# Patient Record
Sex: Male | Born: 1950 | Race: White | Hispanic: No | Smoking: Never smoker
Health system: Southern US, Community
[De-identification: ages and names within clinical notes are randomized; demographics above are authoritative.]

## PROBLEM LIST (undated history)

## (undated) DIAGNOSIS — R339 Retention of urine, unspecified: Secondary | ICD-10-CM

## (undated) DIAGNOSIS — N400 Enlarged prostate without lower urinary tract symptoms: Secondary | ICD-10-CM

## (undated) DIAGNOSIS — F329 Major depressive disorder, single episode, unspecified: Secondary | ICD-10-CM

## (undated) DIAGNOSIS — G629 Polyneuropathy, unspecified: Secondary | ICD-10-CM

## (undated) DIAGNOSIS — G8220 Paraplegia, unspecified: Secondary | ICD-10-CM

## (undated) DIAGNOSIS — E559 Vitamin D deficiency, unspecified: Secondary | ICD-10-CM

## (undated) DIAGNOSIS — F32A Depression, unspecified: Secondary | ICD-10-CM

## (undated) DIAGNOSIS — K5900 Constipation, unspecified: Secondary | ICD-10-CM

## (undated) DIAGNOSIS — I609 Nontraumatic subarachnoid hemorrhage, unspecified: Secondary | ICD-10-CM

## (undated) DIAGNOSIS — K219 Gastro-esophageal reflux disease without esophagitis: Secondary | ICD-10-CM

## (undated) DIAGNOSIS — D689 Coagulation defect, unspecified: Secondary | ICD-10-CM

## (undated) DIAGNOSIS — I671 Cerebral aneurysm, nonruptured: Secondary | ICD-10-CM

## (undated) DIAGNOSIS — F419 Anxiety disorder, unspecified: Secondary | ICD-10-CM

## (undated) DIAGNOSIS — N319 Neuromuscular dysfunction of bladder, unspecified: Secondary | ICD-10-CM

## (undated) HISTORY — PX: SPLENECTOMY: SUR1306

## (undated) HISTORY — PX: CHOLECYSTECTOMY: SHX55

---

## 2006-02-18 DIAGNOSIS — I609 Nontraumatic subarachnoid hemorrhage, unspecified: Secondary | ICD-10-CM

## 2006-02-18 DIAGNOSIS — I671 Cerebral aneurysm, nonruptured: Secondary | ICD-10-CM

## 2006-02-18 DIAGNOSIS — G8220 Paraplegia, unspecified: Secondary | ICD-10-CM

## 2006-02-18 HISTORY — DX: Cerebral aneurysm, nonruptured: I67.1

## 2006-02-18 HISTORY — DX: Paraplegia, unspecified: G82.20

## 2006-02-18 HISTORY — DX: Nontraumatic subarachnoid hemorrhage, unspecified: I60.9

## 2013-05-16 ENCOUNTER — Emergency Department (HOSPITAL_COMMUNITY): Payer: Medicare Other

## 2013-05-16 ENCOUNTER — Emergency Department (HOSPITAL_COMMUNITY)
Admission: EM | Admit: 2013-05-16 | Discharge: 2013-05-17 | Disposition: A | Discharge status: Home / Self Care | Payer: Medicare Other | Attending: Emergency Medicine | Admitting: Emergency Medicine

## 2013-05-16 ENCOUNTER — Encounter (HOSPITAL_COMMUNITY): Payer: Self-pay | Admitting: Emergency Medicine

## 2013-05-16 DIAGNOSIS — R630 Anorexia: Secondary | ICD-10-CM | POA: Insufficient documentation

## 2013-05-16 DIAGNOSIS — R509 Fever, unspecified: Secondary | ICD-10-CM | POA: Insufficient documentation

## 2013-05-16 DIAGNOSIS — N390 Urinary tract infection, site not specified: Secondary | ICD-10-CM | POA: Insufficient documentation

## 2013-05-16 DIAGNOSIS — R5383 Other fatigue: Secondary | ICD-10-CM

## 2013-05-16 DIAGNOSIS — Z7901 Long term (current) use of anticoagulants: Secondary | ICD-10-CM | POA: Insufficient documentation

## 2013-05-16 DIAGNOSIS — Z79899 Other long term (current) drug therapy: Secondary | ICD-10-CM | POA: Insufficient documentation

## 2013-05-16 DIAGNOSIS — Z8679 Personal history of other diseases of the circulatory system: Secondary | ICD-10-CM | POA: Insufficient documentation

## 2013-05-16 DIAGNOSIS — D689 Coagulation defect, unspecified: Secondary | ICD-10-CM | POA: Insufficient documentation

## 2013-05-16 DIAGNOSIS — R5381 Other malaise: Secondary | ICD-10-CM | POA: Insufficient documentation

## 2013-05-16 DIAGNOSIS — R112 Nausea with vomiting, unspecified: Secondary | ICD-10-CM | POA: Insufficient documentation

## 2013-05-16 HISTORY — DX: Coagulation defect, unspecified: D68.9

## 2013-05-16 LAB — URINALYSIS, ROUTINE W REFLEX MICROSCOPIC
Bilirubin Urine: NEGATIVE
GLUCOSE, UA: NEGATIVE mg/dL
Ketones, ur: NEGATIVE mg/dL
Nitrite: POSITIVE — AB
PROTEIN: 30 mg/dL — AB
SPECIFIC GRAVITY, URINE: 1.017 (ref 1.005–1.030)
UROBILINOGEN UA: 1 mg/dL (ref 0.0–1.0)
pH: 7 (ref 5.0–8.0)

## 2013-05-16 LAB — CBC WITH DIFFERENTIAL/PLATELET
Basophils Absolute: 0.1 10*3/uL (ref 0.0–0.1)
Basophils Relative: 1 % (ref 0–1)
EOS ABS: 0.2 10*3/uL (ref 0.0–0.7)
EOS PCT: 2 % (ref 0–5)
HCT: 41.4 % (ref 39.0–52.0)
Hemoglobin: 13.6 g/dL (ref 13.0–17.0)
LYMPHS ABS: 4.4 10*3/uL — AB (ref 0.7–4.0)
Lymphocytes Relative: 40 % (ref 12–46)
MCH: 33.8 pg (ref 26.0–34.0)
MCHC: 32.9 g/dL (ref 30.0–36.0)
MCV: 103 fL — AB (ref 78.0–100.0)
Monocytes Absolute: 1.1 10*3/uL — ABNORMAL HIGH (ref 0.1–1.0)
Monocytes Relative: 10 % (ref 3–12)
NEUTROS PCT: 48 % (ref 43–77)
Neutro Abs: 5.2 10*3/uL (ref 1.7–7.7)
Platelets: 412 10*3/uL — ABNORMAL HIGH (ref 150–400)
RBC: 4.02 MIL/uL — ABNORMAL LOW (ref 4.22–5.81)
RDW: 16 % — AB (ref 11.5–15.5)
WBC: 10.9 10*3/uL — ABNORMAL HIGH (ref 4.0–10.5)

## 2013-05-16 LAB — LIPASE, BLOOD: LIPASE: 24 U/L (ref 11–59)

## 2013-05-16 LAB — COMPREHENSIVE METABOLIC PANEL
ALBUMIN: 3.5 g/dL (ref 3.5–5.2)
ALK PHOS: 84 U/L (ref 39–117)
ALT: 6 U/L (ref 0–53)
AST: 10 U/L (ref 0–37)
BUN: 10 mg/dL (ref 6–23)
CALCIUM: 9.3 mg/dL (ref 8.4–10.5)
CO2: 24 mEq/L (ref 19–32)
Chloride: 104 mEq/L (ref 96–112)
Creatinine, Ser: 1.01 mg/dL (ref 0.50–1.35)
GFR calc Af Amer: >90 mL/min (ref >90)
GFR calc non Af Amer: 79 mL/min — ABNORMAL LOW (ref >90)
GLUCOSE: 98 mg/dL (ref 70–99)
Potassium: 4 mEq/L (ref 3.7–5.3)
Sodium: 142 mEq/L (ref 137–147)
TOTAL PROTEIN: 7 g/dL (ref 6.0–8.3)
Total Bilirubin: 0.4 mg/dL (ref 0.3–1.2)

## 2013-05-16 LAB — PROTIME-INR
INR: 1.32 (ref 0.00–1.49)
PROTHROMBIN TIME: 16.1 s — AB (ref 11.6–15.2)

## 2013-05-16 LAB — URINE MICROSCOPIC-ADD ON

## 2013-05-16 LAB — I-STAT CG4 LACTIC ACID, ED: Lactic Acid, Venous: 1.2 mmol/L (ref 0.5–2.2)

## 2013-05-16 MED ORDER — IOHEXOL 300 MG/ML  SOLN
50.0000 mL | Freq: Once | INTRAMUSCULAR | Status: AC | PRN
  Administered 2013-05-16: 50 mL via ORAL

## 2013-05-16 MED ORDER — IOHEXOL 300 MG/ML  SOLN
100.0000 mL | Freq: Once | INTRAMUSCULAR | Status: AC | PRN
  Administered 2013-05-16: 100 mL via INTRAVENOUS

## 2013-05-16 MED ORDER — DEXTROSE 5 % IV SOLN
1.0000 g | Freq: Once | INTRAVENOUS | Status: AC
  Administered 2013-05-16: 1 g via INTRAVENOUS
  Filled 2013-05-16: qty 10

## 2013-05-16 MED ORDER — ONDANSETRON HCL 4 MG/2ML IJ SOLN
4.0000 mg | Freq: Once | INTRAMUSCULAR | Status: AC
  Administered 2013-05-16: 4 mg via INTRAVENOUS
  Filled 2013-05-16: qty 2

## 2013-05-16 MED ORDER — SODIUM CHLORIDE 0.9 % IV SOLN
Freq: Once | INTRAVENOUS | Status: AC
  Administered 2013-05-16: 22:00:00 via INTRAVENOUS

## 2013-05-16 NOTE — ED Provider Notes (Signed)
CSN: [id number]     Arrival date & time 05/16/13  2013 History   First MD Initiated Contact with Patient 05/16/13 2028     Chief Complaint  Patient presents with  . Fever  . Nausea     (Consider location/radiation/quality/duration/timing/severity/associated sxs/prior Treatment) HPI Comments: Patient presents from nursing facility with nausea, vomiting abdominal pain and ongoing since 4 AM. He states he vomited 16 times a day that was reported to send nonbloody and nonbilious. Had some diarrhea yesterday x2 but none today. His diffuse crampy abdominal pain. It is felt warm but did not check his temperature. Denies sick contacts. He's been at [facility] care for the past 3 weeks after being hospitalized for over a month at an outside hospital. He stated he had bilious emesis at that time. He states there is no cause identified. 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 Patient states he has not had it in two days because there was some talk of switching him to xarelto which he didn't want to do. He's had multiple clots intra-abdominal as well as in his extremities. He denies any chest pain or shortness of breath. He denies any back pain. He denies any recent antibiotic use but he is uncertain. Has a suprapubic catheter in place.  The history is provided by the patient and the EMS personnel.    Past Medical History  Diagnosis Date  . Aneurysm   . Clotting disorder    History reviewed. No pertinent past surgical history. History reviewed. No pertinent family history. History  Substance Use Topics  . Smoking status: Never Smoker   . Smokeless tobacco: Never Used  . Alcohol Use: No    Review of Systems  Constitutional: Positive for fever, activity change and appetite change.  HENT: Negative for congestion and rhinorrhea.   Respiratory: Negative for cough, chest tightness and shortness of breath.   Cardiovascular: Negative for chest pain.  Gastrointestinal:  Positive for nausea, vomiting and abdominal pain. Negative for diarrhea.  Genitourinary: Negative for dysuria and hematuria.  Musculoskeletal: Negative for arthralgias, back pain and myalgias.  Skin: Negative for rash.  Neurological: Positive for weakness. Negative for dizziness and headaches.  A complete 10 system review of systems was obtained and all systems are negative except as noted in the HPI and PMH.      Allergies  Review of patient's allergies indicates no known allergies.  Home Medications   Current Outpatient Rx  Name  Route  Sig  Dispense  Refill  . acetaminophen (TYLENOL) 325 MG tablet   Oral   Take 650 mg by mouth every 6 (six) hours as needed for mild pain.         Marland Kitchen alum & mag hydroxide-simeth (MAALOX/MYLANTA) 200-200-20 MG/5ML suspension   Oral   Take 30 mLs by mouth every 6 (six) hours as needed for indigestion or heartburn.         . diazepam (VALIUM) 5 MG tablet   Oral   Take 5 mg by mouth 2 (two) times daily.         . folic acid (FOLVITE) 1 MG tablet   Oral   Take 1 mg by mouth daily.         . furosemide (LASIX) 20 MG tablet   Oral   Take 20 mg by mouth as needed for fluid or edema.         Marland Kitchen LORazepam (ATIVAN) 0.5 MG tablet   Oral   Take  0.5 mg by mouth every 8 (eight) hours as needed for anxiety.         . Multiple Vitamin (MULTIVITAMIN WITH MINERALS) TABS tablet   Oral   Take 1 tablet by mouth daily.         . mupirocin ointment (BACTROBAN) 2 %   Nasal   Place 1 application into the nose 3 (three) times daily.         . QUEtiapine (SEROQUEL) 25 MG tablet   Oral   Take 25 mg by mouth at bedtime.         . Rivaroxaban (XARELTO) 20 MG TABS tablet   Oral   Take 20 mg by mouth daily with supper.         . sertraline (ZOLOFT) 100 MG tablet   Oral   Take 100 mg by mouth daily.         . tamsulosin (FLOMAX) 0.4 MG CAPS capsule   Oral   Take 0.4 mg by mouth.         . Vitamin D, Ergocalciferol, (DRISDOL) 50000  UNITS CAPS capsule   Oral   Take 50,000 Units by mouth every 7 (seven) days. Wednesday          BP 145/81  Pulse 83  Temp(Src) 99.6 F (37.6 C) (Oral)  Resp 16  Wt 180 lb (81.647 kg)  SpO2 98% Physical Exam  Constitutional: He is oriented to person, place, and time. He appears well-developed and well-nourished. No distress.  HENT:  Head: Normocephalic and atraumatic.  Mouth/Throat: Oropharynx is clear and moist. No oropharyngeal exudate.  Eyes: Conjunctivae and EOM are normal. Pupils are equal, round, and reactive to light.  Neck: Normal range of motion. Neck supple.  Cardiovascular: Normal rate, regular rhythm and normal heart sounds.   Pulmonary/Chest: Effort normal and breath sounds normal. No respiratory distress.  Abdominal: Soft. There is no tenderness. There is no rebound and no guarding.  Suprapubic catheter in place. Abdomen soft, nontender, nondistended  Musculoskeletal: Normal range of motion. He exhibits no edema and no tenderness.  Intact DP and PT pulses  Neurological: He is alert and oriented to person, place, and time. No cranial nerve deficit. He exhibits normal muscle tone. Coordination normal.  Skin: Skin is warm.    ED Course  Procedures (including critical care time) Labs Review Labs Reviewed  CBC WITH DIFFERENTIAL - Abnormal; Notable for the following:    WBC 10.9 (*)    RBC 4.02 (*)    MCV 103.0 (*)    RDW 16.0 (*)    Platelets 412 (*)    Lymphs Abs 4.4 (*)    Monocytes Absolute 1.1 (*)    All other components within normal limits  COMPREHENSIVE METABOLIC PANEL - Abnormal; Notable for the following:    GFR calc non Af Amer 79 (*)    All other components within normal limits  URINALYSIS, ROUTINE W REFLEX MICROSCOPIC - Abnormal; Notable for the following:    APPearance CLOUDY (*)    Hgb urine dipstick MODERATE (*)    Protein, ur 30 (*)    Nitrite POSITIVE (*)    Leukocytes, UA SMALL (*)    All other components within normal limits   PROTIME-INR - Abnormal; Notable for the following:    Prothrombin Time 16.1 (*)    All other components within normal limits  URINE MICROSCOPIC-ADD ON - Abnormal; Notable for the following:    Bacteria, UA MANY (*)    Casts HYALINE CASTS (*)  All other components within normal limits  URINE CULTURE  LIPASE, BLOOD  I-STAT CG4 LACTIC ACID, ED   Imaging Review Ct Abdomen Pelvis W Contrast  05/17/2013   CLINICAL DATA:  Diffuse abdominal pain.  Fever.  Nausea.  EXAM: CT ABDOMEN AND PELVIS WITH CONTRAST  TECHNIQUE: Multidetector CT imaging of the abdomen and pelvis was performed using the standard protocol following bolus administration of intravenous contrast.  CONTRAST:  50mL OMNIPAQUE IOHEXOL 300 MG/ML SOLN, 100mL OMNIPAQUE IOHEXOL 300 MG/ML SOLN  COMPARISON:  None.  FINDINGS: BODY WALL: Unremarkable.  LOWER CHEST: Mild cardiomegaly.  Bibasilar atelectasis.  ABDOMEN/PELVIS:  Liver: No focal abnormality. Multiple small vascular structures in the porta hepatis consistent with cavernous transformation. There is calcification within the portal venous system which is likely from remote thrombus.  Biliary: Cholecystectomy.  Pancreas: No acute abnormality.  Spleen: Surgically absent.  Adrenals: Unremarkable.  Kidneys and ureters: No hydronephrosis or stone. Presumed sub cm cyst in the interpolar left kidney.  Bladder: Suprapubic catheter. The bladder is thick walled, but decompressed. No focal area of masslike enhancement.  Reproductive: Unremarkable.  Bowel: No obstruction. Normal appendix. Few distal colonic diverticula.  Retroperitoneum: No mass or adenopathy.  Peritoneum: No free fluid or gas.  Vascular: Flat IVC and bilateral iliac systems consistent with chronic occlusion. There are numerous venous collaterals in the subcutaneous abdominal wall.  OSSEOUS: Nonspecific intrathecal density/ calcification at the level of the lower thoracic spine, likely dystrophic calcifications from previous inflammation.   IMPRESSION: 1. No acute intra-abdominal findings. 2. 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   Electronically Signed   By: Tiburcio PeaJonathan  Watts M.D.   On: 05/17/2013 00:20     EKG Interpretation None      MDM   Final diagnoses:  Nausea and vomiting  Urinary tract infection   Nausea, vomiting, abdominal pain since 4 AM. Recent hospitalization for what the patient says with bilious emesis. Emesis is nonbilious. His abdomen is soft without peritoneal signs.  UA appears infected though the patient has suprapubic catheter. Labs unremarkable. Abdomen soft without peritoneal signs.  CT scan shows no acute findings. Patient's INR is 1.3. Patient insists he has not taken xarelto though it is listed on his MAR as given at 5pm. It seems patient is unclear about his anticoagulation. He states he would rather be on Coumadin. He will address this with his doctor at the facility tomorrow. As he received xarelto at 5 PM, we'll not give Lovenox.  Patient vomited after drinking contrast, but no further vomiting in the ED.  Tolerating PO.  Will treat UTI and send culture.   BP 145/81  Pulse 83  Temp(Src) 99.6 F (37.6 C) (Oral)  Resp 16  Wt 180 lb (81.647 kg)  SpO2 98%    Glynn OctaveStephen Talasia Saulter, MD 05/17/13 [phone]

## 2013-05-16 NOTE — ED Notes (Signed)
Pt BIB EMS, from [facility], pt reports n/v and abdominal pain since 0400 this morning. Noted to have a fever, no medication given at facility. Pt a&o x4, non ambulatory, skin warm and dry

## 2013-05-17 MED ORDER — ONDANSETRON 8 MG PO TBDP
8.0000 mg | ORAL_TABLET | Freq: Once | ORAL | Status: AC
  Administered 2013-05-17: 8 mg via ORAL
  Filled 2013-05-17: qty 1

## 2013-05-17 MED ORDER — ONDANSETRON HCL 4 MG PO TABS: 4.0000 mg | ORAL_TABLET | Freq: Four times a day (QID) | ORAL | Status: DC

## 2013-05-17 MED ORDER — CEPHALEXIN 500 MG PO CAPS: 500.0000 mg | ORAL_CAPSULE | Freq: Four times a day (QID) | ORAL | Status: DC

## 2013-05-17 MED ORDER — ENOXAPARIN SODIUM 100 MG/ML ~~LOC~~ SOLN: 82.0000 mg | Freq: Once | SUBCUTANEOUS | Status: DC

## 2013-05-17 NOTE — ED Notes (Signed)
[facility] called to transport pt at this time. 

## 2013-05-17 NOTE — Discharge Instructions (Signed)
Urinary Tract Infection  take the antibiotic and nausea medicine as prescribed. address your anticoagulation with your doctor tomorrow. You received the blood thinner medicine earlier tonight. Your records show that you have received xarelto.   Return to the ED if you develop new or worsening symptoms. Urinary tract infections (UTIs) can develop anywhere along your urinary tract. Your urinary tract is your body's drainage system for removing wastes and extra water. Your urinary tract includes two kidneys, two ureters, a bladder, and a urethra. Your kidneys are a pair of bean-shaped organs. Each kidney is about the size of your fist. They are located below your ribs, one on each side of your spine. CAUSES Infections are caused by microbes, which are microscopic organisms, including fungi, viruses, and bacteria. These organisms are so small that they can only be seen through a microscope. Bacteria are the microbes that most commonly cause UTIs. SYMPTOMS  Symptoms of UTIs may vary by age and gender of the patient and by the location of the infection. Symptoms in young women typically include a frequent and intense urge to urinate and a painful, burning feeling in the bladder or urethra during urination. Older women and men are more likely to be tired, shaky, and weak and have muscle aches and abdominal pain. A fever may mean the infection is in your kidneys. Other symptoms of a kidney infection include pain in your back or sides below the ribs, nausea, and vomiting. DIAGNOSIS To diagnose a UTI, your caregiver will ask you about your symptoms. Your caregiver also will ask to provide a urine sample. The urine sample will be tested for bacteria and white blood cells. White blood cells are made by your body to help fight infection. TREATMENT  Typically, UTIs can be treated with medication. Because most UTIs are caused by a bacterial infection, they usually can be treated with the use of antibiotics. The choice  of antibiotic and length of treatment depend on your symptoms and the type of bacteria causing your infection. HOME CARE INSTRUCTIONS  If you were prescribed antibiotics, take them exactly as your caregiver instructs you. Finish the medication even if you feel better after you have only taken some of the medication.  Drink enough water and fluids to keep your urine clear or pale yellow.  Avoid caffeine, tea, and carbonated beverages. They tend to irritate your bladder.  Empty your bladder often. Avoid holding urine for long periods of time.  Empty your bladder before and after sexual intercourse.  After a bowel movement, women should cleanse from front to back. Use each tissue only once. SEEK MEDICAL CARE IF:   You have back pain.  You develop a fever.  Your symptoms do not begin to resolve within 3 days. SEEK IMMEDIATE MEDICAL CARE IF:   You have severe back pain or lower abdominal pain.  You develop chills.  You have nausea or vomiting.  You have continued burning or discomfort with urination. MAKE SURE YOU:   Understand these instructions.  Will watch your condition.  Will get help right away if you are not doing well or get worse. Document Released: 11/14/2004 Document Revised: 08/06/2011 Document Reviewed: 03/15/2011 [facility] Patient Information 2014 [employer] [place].

## 2013-05-17 NOTE — ED Notes (Signed)
Pt noted to have vomited in CT, MD made aware

## 2013-05-18 LAB — URINE CULTURE

## 2013-08-23 ENCOUNTER — Ambulatory Visit (INDEPENDENT_AMBULATORY_CARE_PROVIDER_SITE_OTHER): Payer: Medicare Other | Admitting: Diagnostic Neuroimaging

## 2013-08-23 ENCOUNTER — Encounter: Payer: Self-pay | Admitting: Diagnostic Neuroimaging

## 2013-08-23 VITALS — BP 103/71 | HR 81 | Temp 97.6°F

## 2013-08-23 DIAGNOSIS — Z9889 Other specified postprocedural states: Secondary | ICD-10-CM

## 2013-08-23 DIAGNOSIS — M79609 Pain in unspecified limb: Secondary | ICD-10-CM

## 2013-08-23 DIAGNOSIS — M62838 Other muscle spasm: Secondary | ICD-10-CM

## 2013-08-23 DIAGNOSIS — F329 Major depressive disorder, single episode, unspecified: Secondary | ICD-10-CM

## 2013-08-23 DIAGNOSIS — Z8679 Personal history of other diseases of the circulatory system: Secondary | ICD-10-CM

## 2013-08-23 DIAGNOSIS — F3289 Other specified depressive episodes: Secondary | ICD-10-CM

## 2013-08-23 DIAGNOSIS — G8220 Paraplegia, unspecified: Secondary | ICD-10-CM

## 2013-08-23 DIAGNOSIS — I609 Nontraumatic subarachnoid hemorrhage, unspecified: Secondary | ICD-10-CM

## 2013-08-23 DIAGNOSIS — M79606 Pain in leg, unspecified: Secondary | ICD-10-CM

## 2013-08-23 DIAGNOSIS — F32A Depression, unspecified: Secondary | ICD-10-CM

## 2013-08-23 NOTE — Patient Instructions (Signed)
I will setup referral to palliative care.

## 2013-08-23 NOTE — Progress Notes (Signed)
[facility]  PATIENT: Daniel Wolfe DOB: Sep 09, 1950  REFERRING CLINICIAN: Marquis Lunch HISTORY FROM: patient REASON FOR VISIT: new consult   HISTORICAL  CHIEF COMPLAINT:  No chief complaint on file.   HISTORY OF PRESENT ILLNESS:   63 year old right-handed male with history of subarachnoid hemorrhage, status post cerebral aneurysm coiling ([facility]) in 2008, here for evaluation of lower extremity weakness.  02/19/06 patient went to the hospital with nausea, vomiting, malaise. While in the hospital patient had cardiac arrest, diagnosed with subarachnoid hemorrhage. Patient was treated with endovascular coiling. He was in a "coma" for 4 months. Patient went to rehabilitation and skilled nursing facility following this. Patient never was able to walk after this event to 2008. He had significant weakness of his lower extremities with pain, spasms, mobility issues.  Over past few months patient has had increasing lower extremity weakness and trouble with transferring. Patient is asking me to refer him to neurosurgery to "remove the coils", because patient is tired of living and wants to end it all.  Patient has significant depression and hopelessness. He saw psychiatry in the past. He's not interested in any further testing, treatment or evaluation.   REVIEW OF SYSTEMS: Full 14 system review of systems performed and notable only for weight loss fatigue swelling in legs hearing loss spinning sensation urination problem allergy joint pain joint swelling 8 muscle blurred vision easy bruising easy bleeding memory loss confusion weakness dizziness depression anxiety not asleep decrease or change in appetite disinterest activity suicidal thoughts racing thoughts.  ALLERGIES: No Known Allergies  HOME MEDICATIONS: Outpatient Prescriptions Prior to Visit  Medication Sig Dispense Refill  . acetaminophen (TYLENOL) 325 MG tablet Take 650 mg by mouth  every 6 (six) hours as needed for mild pain.      . diazepam (VALIUM) 5 MG tablet Take 5 mg by mouth 2 (two) times daily.      . furosemide (LASIX) 20 MG tablet Take 20 mg by mouth as needed for fluid or edema.      Marland Kitchen LORazepam (ATIVAN) 0.5 MG tablet Take 0.5 mg by mouth every 8 (eight) hours as needed for anxiety.      . Multiple Vitamin (MULTIVITAMIN WITH MINERALS) TABS tablet Take 1 tablet by mouth daily.      . mupirocin ointment (BACTROBAN) 2 % Place 1 application into the nose 3 (three) times daily.      . sertraline (ZOLOFT) 100 MG tablet Take 100 mg by mouth daily.      . tamsulosin (FLOMAX) 0.4 MG CAPS capsule Take 0.4 mg by mouth.      . Vitamin D, Ergocalciferol, (DRISDOL) 50000 UNITS CAPS capsule Take 50,000 Units by mouth every 7 (seven) days. Wednesday      . alum & mag hydroxide-simeth (MAALOX/MYLANTA) 200-200-20 MG/5ML suspension Take 30 mLs by mouth every 6 (six) hours as needed for indigestion or heartburn.      . cephALEXin (KEFLEX) 500 MG capsule Take 1 capsule (500 mg total) by mouth 4 (four) times daily.  40 capsule  0  . folic acid (FOLVITE) 1 MG tablet Take 1 mg by mouth daily.      . ondansetron (ZOFRAN) 4 MG tablet Take 1 tablet (4 mg total) by mouth every 6 (six) hours.  12 tablet  0  . QUEtiapine (SEROQUEL) 25 MG tablet Take 25 mg by mouth at bedtime.      . Rivaroxaban (XARELTO) 20 MG TABS tablet Take 20 mg by  mouth daily with supper.       No facility-administered medications prior to visit.    PAST MEDICAL HISTORY: Past Medical History  Diagnosis Date  . Aneurysm   . Clotting disorder     PAST SURGICAL HISTORY: History reviewed. No pertinent past surgical history.  FAMILY HISTORY: Family History  Problem Relation Age of Onset  . Lung cancer Mother   . Other Father     lung problems/not sure of cause of death    SOCIAL HISTORY:  History   Social History  . Marital Status: Divorced    Spouse Name: N/A    Number of Children: 1  . Years of  Education: College   Occupational History  .  Other    Retired   Social History Main Topics  . Smoking status: Never Smoker   . Smokeless tobacco: Never Used  . Alcohol Use: No  . Drug Use: No  . Sexual Activity: Not Currently   Other Topics Concern  . Not on file   Social History Narrative   Patient lives in [facility] assisted [facility]   Caffeine Use: none     PHYSICAL EXAM  Filed Vitals:   08/23/13 1036  BP: 103/71  Pulse: 81  Temp: 97.6 F (36.4 C)  TempSrc: Oral    Not recorded    Cannot calculate BMI with a height equal to zero.  GENERAL EXAM: Patient is in no distress; well developed, nourished and groomed; neck is supple; FLAT AFFECT.  CARDIOVASCULAR: Regular rate and rhythm, no murmurs, no carotid bruits  NEUROLOGIC: MENTAL STATUS: awake, alert, oriented to person, place and time, recent and remote memory intact, normal attention and concentration, language fluent, comprehension intact, naming intact, fund of knowledge appropriate CRANIAL NERVE: no papilledema on fundoscopic exam, pupils equal and reactive to light, visual fields full to confrontation, extraocular muscles intact, no nystagmus, facial sensation and strength symmetric, hearing intact, palate elevates symmetrically, uvula midline, shoulder shrug symmetric, tongue midline. MOTOR: BUE 4/5; BLE INCREASE TONE, RESTLESS, SPASMS; BLE 2-3/5 PROX, 1-2 DISTAL.  SENSORY: ABSENT VIB AT TOES AND ANKLES COORDINATION: finger-nose-finger, fine finger movements SLOW REFLEXES: deep tendon reflexes present and symmetric GAIT/STATION: IN WHEELCHAIR, CANNOT STAND    DIAGNOSTIC DATA (LABS, IMAGING, TESTING) - I reviewed patient records, labs, notes, testing and imaging myself where available.  Lab Results  Component Value Date   WBC 10.9* 05/16/2013   HGB 13.6 05/16/2013   HCT 41.4 05/16/2013   MCV 103.0* 05/16/2013   PLT 412* 05/16/2013      Component Value Date/Time   NA 142 05/16/2013 2035   K 4.0  05/16/2013 2035   CL 104 05/16/2013 2035   CO2 24 05/16/2013 2035   GLUCOSE 98 05/16/2013 2035   BUN 10 05/16/2013 2035   CREATININE 1.01 05/16/2013 2035   CALCIUM 9.3 05/16/2013 2035   PROT 7.0 05/16/2013 2035   ALBUMIN 3.5 05/16/2013 2035   AST 10 05/16/2013 2035   ALT 6 05/16/2013 2035   ALKPHOS 84 05/16/2013 2035   BILITOT 0.4 05/16/2013 2035   GFRNONAA 79* 05/16/2013 2035   GFRAA >90 05/16/2013 2035   No results found for this basename: CHOL, HDL, LDLCALC, LDLDIRECT, TRIG, CHOLHDL   No results found for this basename: HGBA1C   No results found for this basename: VITAMINB12   No results found for this basename: TSH     ASSESSMENT AND PLAN  63 y.o. year old male here with history of subarachnoid hemorrhage, [facility], here for evaluation of lower extremity weakness. Patient's real concern is about his quality of life and day-to-day living/existence. Patient inquiring about "ending it all". He does not have an active plan, and can contract for safety. I had long discussion about palliative care and he is interested. I encouraged him to pursue pain mgmt and psychiatry treatment, but he does not want these referrals. He is not interested in further testing / treatment re: his lower ext weakness.    PLAN:  Orders Placed This Encounter  Procedures  . Amb Referral to Palliative Care   Return for return to PCP.    Suanne MarkerVIKRAM R. Jacee Enerson, MD 08/23/2013, 11:55 AM Certified in Neurology, Neurophysiology and Neuroimaging  [facility] [address], [address] [place], [place] [address] [phone]

## 2014-01-06 ENCOUNTER — Encounter (HOSPITAL_COMMUNITY): Payer: Self-pay | Admitting: Nurse Practitioner

## 2014-01-06 ENCOUNTER — Emergency Department (HOSPITAL_COMMUNITY)
Admission: EM | Admit: 2014-01-06 | Discharge: 2014-01-06 | Disposition: A | Discharge status: Home / Self Care | Payer: Medicare Other | Attending: Emergency Medicine | Admitting: Emergency Medicine

## 2014-01-06 DIAGNOSIS — Y848 Other medical procedures as the cause of abnormal reaction of the patient, or of later complication, without mention of misadventure at the time of the procedure: Secondary | ICD-10-CM | POA: Insufficient documentation

## 2014-01-06 DIAGNOSIS — L03319 Cellulitis of trunk, unspecified: Secondary | ICD-10-CM | POA: Diagnosis not present

## 2014-01-06 DIAGNOSIS — I729 Aneurysm of unspecified site: Secondary | ICD-10-CM | POA: Diagnosis not present

## 2014-01-06 DIAGNOSIS — Z862 Personal history of diseases of the blood and blood-forming organs and certain disorders involving the immune mechanism: Secondary | ICD-10-CM | POA: Insufficient documentation

## 2014-01-06 DIAGNOSIS — T8359XA Infection and inflammatory reaction due to prosthetic device, implant and graft in urinary system, initial encounter: Secondary | ICD-10-CM | POA: Insufficient documentation

## 2014-01-06 DIAGNOSIS — L0390 Cellulitis, unspecified: Secondary | ICD-10-CM

## 2014-01-06 LAB — CBC WITH DIFFERENTIAL/PLATELET
BASOS ABS: 0.1 10*3/uL (ref 0.0–0.1)
BASOS PCT: 1 % (ref 0–1)
Eosinophils Absolute: 0.8 10*3/uL — ABNORMAL HIGH (ref 0.0–0.7)
Eosinophils Relative: 6 % — ABNORMAL HIGH (ref 0–5)
HEMATOCRIT: 41.7 % (ref 39.0–52.0)
HEMOGLOBIN: 13.6 g/dL (ref 13.0–17.0)
Lymphocytes Relative: 21 % (ref 12–46)
Lymphs Abs: 2.7 10*3/uL (ref 0.7–4.0)
MCH: 31.1 pg (ref 26.0–34.0)
MCHC: 32.6 g/dL (ref 30.0–36.0)
MCV: 95.4 fL (ref 78.0–100.0)
MONO ABS: 1.7 10*3/uL — AB (ref 0.1–1.0)
Monocytes Relative: 13 % — ABNORMAL HIGH (ref 3–12)
NEUTROS ABS: 7.6 10*3/uL (ref 1.7–7.7)
Neutrophils Relative %: 59 % (ref 43–77)
Platelets: 470 10*3/uL — ABNORMAL HIGH (ref 150–400)
RBC: 4.37 MIL/uL (ref 4.22–5.81)
RDW: 16.1 % — AB (ref 11.5–15.5)
WBC: 12.8 10*3/uL — ABNORMAL HIGH (ref 4.0–10.5)

## 2014-01-06 LAB — COMPREHENSIVE METABOLIC PANEL
ALBUMIN: 2.9 g/dL — AB (ref 3.5–5.2)
ALK PHOS: 91 U/L (ref 39–117)
ALT: 5 U/L (ref 0–53)
AST: 13 U/L (ref 0–37)
Anion gap: 11 (ref 5–15)
BILIRUBIN TOTAL: 0.3 mg/dL (ref 0.3–1.2)
BUN: 9 mg/dL (ref 6–23)
CHLORIDE: 97 meq/L (ref 96–112)
CO2: 27 mEq/L (ref 19–32)
Calcium: 9.3 mg/dL (ref 8.4–10.5)
Creatinine, Ser: 0.94 mg/dL (ref 0.50–1.35)
GFR calc Af Amer: >90 mL/min (ref >90)
GFR, EST NON AFRICAN AMERICAN: 89 mL/min — AB (ref >90)
Glucose, Bld: 101 mg/dL — ABNORMAL HIGH (ref 70–99)
POTASSIUM: 4.3 meq/L (ref 3.7–5.3)
Sodium: 135 mEq/L — ABNORMAL LOW (ref 137–147)
Total Protein: 6.9 g/dL (ref 6.0–8.3)

## 2014-01-06 MED ORDER — CEPHALEXIN 500 MG PO CAPS: 500.0000 mg | ORAL_CAPSULE | Freq: Four times a day (QID) | ORAL | Status: DC

## 2014-01-06 MED ORDER — CEPHALEXIN 500 MG PO CAPS
500.0000 mg | ORAL_CAPSULE | Freq: Once | ORAL | Status: AC
  Administered 2014-01-06: 500 mg via ORAL
  Filled 2014-01-06: qty 1

## 2014-01-06 NOTE — ED Provider Notes (Signed)
CSN: [id number]     Arrival date & time 01/06/14  1948 History   First MD Initiated Contact with Patient 01/06/14 2021     Chief Complaint  Patient presents with  . Suprapubic Cath Problem       HPI [facility], c/o of suprapubic catheter pain and abnormal drainage. Pt also taking Coumadin, no blood drainage noted at this time. Past Medical History  Diagnosis Date  . Aneurysm   . Clotting disorder    History reviewed. No pertinent past surgical history. Family History  Problem Relation Age of Onset  . Lung cancer Mother   . Other Father     lung problems/not sure of cause of death   History  Substance Use Topics  . Smoking status: Never Smoker   . Smokeless tobacco: Never Used  . Alcohol Use: No    Review of Systems  All other systems reviewed and are negative.     Allergies  Penicillins and Sulfa antibiotics  Home Medications   Prior to Admission medications   Medication Sig Start Date End Date Taking? Authorizing Provider  baclofen (LIORESAL) 10 MG tablet Take 5 mg by mouth daily.   Yes Historical Provider, MD  bisacodyl (DULCOLAX) 5 MG EC tablet Take 5 mg by mouth daily.    Yes Historical Provider, MD  Cranberry 250 MG CAPS Take 1 capsule by mouth 2 (two) times daily.   Yes Historical Provider, MD  diazepam (VALIUM) 5 MG tablet Take 5 mg by mouth 2 (two) times daily.   Yes Historical Provider, MD  DULoxetine (CYMBALTA) 30 MG capsule Take 30 mg by mouth daily.   Yes Historical Provider, MD  gabapentin (NEURONTIN) 300 MG capsule Take 300 mg by mouth 3 (three) times daily.   Yes Historical Provider, MD  LORazepam (ATIVAN) 0.5 MG tablet Take 0.5 mg by mouth daily as needed for anxiety (anxiety).    Yes Historical Provider, MD  morphine (MS CONTIN) 15 MG 12 hr tablet Take 15 mg by mouth 2 (two) times daily.   Yes Historical Provider, MD  POLYETHYLENE GLYCOL 3350 PO Take 17 g/mL by mouth daily.    Yes Historical Provider, MD  QUEtiapine (SEROQUEL) 50  MG tablet Take 50 mg by mouth 2 (two) times daily.    Yes Historical Provider, MD  ranitidine (ZANTAC) 150 MG tablet Take 150 mg by mouth 2 (two) times daily.   Yes Historical Provider, MD  sertraline (ZOLOFT) 100 MG tablet Take 100 mg by mouth daily.   Yes Historical Provider, MD  tamsulosin (FLOMAX) 0.4 MG CAPS capsule Take 0.4 mg by mouth at bedtime.    Yes Historical Provider, MD  traMADol (ULTRAM) 50 MG tablet Take 50 mg by mouth every 6 (six) hours as needed for moderate pain (pain).    Yes Historical Provider, MD  Vitamin D, Ergocalciferol, (DRISDOL) 50000 UNITS CAPS capsule Take 50,000 Units by mouth every 7 (seven) days. Wednesday   Yes Historical Provider, MD  warfarin (COUMADIN) 5 MG tablet Take 5 mg by mouth daily.   Yes Historical Provider, MD  acetaminophen (TYLENOL) 325 MG tablet Take 650 mg by mouth every 6 (six) hours as needed for mild pain.    Historical Provider, MD  bisacodyl (DULCOLAX) 10 MG suppository Place 10 mg rectally daily as needed for moderate constipation.    Historical Provider, MD  cephALEXin (KEFLEX) 500 MG capsule Take 1 capsule (500 mg total) by mouth 4 (four) times daily. 01/06/14   Molly Maduroobert  Judye BosL Ramandeep Arington, MD  mupirocin ointment (BACTROBAN) 2 % Place 1 application into the nose 3 (three) times daily as needed.     Historical Provider, MD   BP 100/68 mmHg  Pulse 85  Temp(Src) 97.8 F (36.6 C) (Oral)  Resp 14  SpO2 90% Physical Exam  Constitutional: He is oriented to person, place, and time. He appears well-developed and well-nourished. No distress.  HENT:  Head: Normocephalic and atraumatic.  Eyes: Pupils are equal, round, and reactive to light.  Neck: Normal range of motion. Neck supple.  Cardiovascular: Normal rate and intact distal pulses.   Pulmonary/Chest: No respiratory distress. He has no wheezes.  Abdominal: Normal appearance. He exhibits no distension. There is no tenderness. There is no rebound.  Musculoskeletal: Normal range of motion.   Lymphadenopathy:    He has no cervical adenopathy.  Neurological: He is alert and oriented to person, place, and time. No cranial nerve deficit.  Skin: Skin is warm and dry. No rash noted. There is erythema (around suprapubic site.).  Psychiatric: He has a normal mood and affect. His behavior is normal.  Nursing note and vitals reviewed.   ED Course  Procedures (including critical care time) Medications  cephALEXin (KEFLEX) capsule 500 mg (not administered)    Labs Review Labs Reviewed  CBC WITH DIFFERENTIAL - Abnormal; Notable for the following:    WBC 12.8 (*)    RDW 16.1 (*)    Platelets 470 (*)    Monocytes Relative 13 (*)    Monocytes Absolute 1.7 (*)    Eosinophils Relative 6 (*)    Eosinophils Absolute 0.8 (*)    All other components within normal limits  COMPREHENSIVE METABOLIC PANEL - Abnormal; Notable for the following:    Sodium 135 (*)    Glucose, Bld 101 (*)    Albumin 2.9 (*)    GFR calc non Af Amer 89 (*)    All other components within normal limits  WOUND CULTURE    Imaging Review No results found.  I spoke with urology who felt the patient could be discharged on PO treatment.  Will return if he devolops fever or no improvement.  MDM   Final diagnoses:  Cellulitis, unspecified cellulitis site, unspecified extremity site, unspecified laterality        Nelia Shiobert L Jerran Tappan, MD 01/06/14 2256

## 2014-01-06 NOTE — ED Notes (Signed)
[facility] c/o of suprapubic catheter pain and abnormal drainage. Pt also taking Coumadin, no blood drainage noted at this time. Pt in NAD, AOx4.

## 2014-01-06 NOTE — ED Notes (Signed)
Lab at bedside

## 2014-01-06 NOTE — ED Notes (Signed)
MD at bedside. 

## 2014-01-06 NOTE — ED Notes (Signed)
[facility] Expected date: 01/06/14 Expected time: 7:32 PM Means of arrival: Ambulance Comments: Suprapubic catheter problems

## 2014-01-06 NOTE — Discharge Instructions (Signed)
Cellulitis Cellulitis is an infection of the skin and the tissue under the skin. The infected area is usually red and tender. This happens most often in the arms and lower legs. HOME CARE   Take your antibiotic medicine as told. Finish the medicine even if you start to feel better.  Keep the infected arm or leg raised (elevated).  Put a warm cloth on the area up to 4 times per day.  Only take medicines as told by your doctor.  Keep all doctor visits as told. GET HELP IF:  You see red streaks on the skin coming from the infected area.  Your red area gets bigger or turns a dark color.  Your bone or joint under the infected area is painful after the skin heals.  Your infection comes back in the same area or different area.  You have a puffy (swollen) bump in the infected area.  You have new symptoms.  You have a fever. GET HELP RIGHT AWAY IF:   You feel very sleepy.  You throw up (vomit) or have watery poop (diarrhea).  You feel sick and have muscle aches and pains. MAKE SURE YOU:   Understand these instructions.  Will watch your condition.  Will get help right away if you are not doing well or get worse. Document Released: 07/24/2007 Document Revised: 06/21/2013 Document Reviewed: 04/22/2011 [facility] Patient Information 2015 [employer] [place]. This information is not intended to replace advice given to you by your health care provider. Make sure you discuss any questions you have with your health care provider.  Cellulitis Cellulitis is an infection of the skin and the tissue under the skin. The infected area is usually red and tender. This happens most often in the arms and lower legs. HOME CARE   Take your antibiotic medicine as told. Finish the medicine even if you start to feel better.  Keep the infected arm or leg raised (elevated).  Put a warm cloth on the area up to 4 times per day.  Only take medicines as told by your doctor.  Keep all doctor visits as  told. GET HELP IF:  You see red streaks on the skin coming from the infected area.  Your red area gets bigger or turns a dark color.  Your bone or joint under the infected area is painful after the skin heals.  Your infection comes back in the same area or different area.  You have a puffy (swollen) bump in the infected area.  You have new symptoms.  You have a fever. GET HELP RIGHT AWAY IF:   You feel very sleepy.  You throw up (vomit) or have watery poop (diarrhea).  You feel sick and have muscle aches and pains. MAKE SURE YOU:   Understand these instructions.  Will watch your condition.  Will get help right away if you are not doing well or get worse. Document Released: 07/24/2007 Document Revised: 06/21/2013 Document Reviewed: 04/22/2011 [facility] Patient Information 2015 [employer], [place]. This information is not intended to replace advice given to you by your health care provider. Make sure you discuss any questions you have with your health care provider.

## 2014-01-09 LAB — WOUND CULTURE: SPECIAL REQUESTS: NORMAL

## 2014-03-29 ENCOUNTER — Emergency Department (HOSPITAL_COMMUNITY)
Admission: EM | Admit: 2014-03-29 | Discharge: 2014-03-29 | Disposition: A | Discharge status: Home / Self Care | Payer: Medicare Other | Attending: Emergency Medicine | Admitting: Emergency Medicine

## 2014-03-29 ENCOUNTER — Encounter (HOSPITAL_COMMUNITY): Payer: Self-pay | Admitting: *Deleted

## 2014-03-29 DIAGNOSIS — Z880 Allergy status to penicillin: Secondary | ICD-10-CM | POA: Insufficient documentation

## 2014-03-29 DIAGNOSIS — R112 Nausea with vomiting, unspecified: Secondary | ICD-10-CM

## 2014-03-29 DIAGNOSIS — T8351XA Infection and inflammatory reaction due to indwelling urinary catheter, initial encounter: Secondary | ICD-10-CM | POA: Diagnosis not present

## 2014-03-29 DIAGNOSIS — Z79899 Other long term (current) drug therapy: Secondary | ICD-10-CM | POA: Insufficient documentation

## 2014-03-29 DIAGNOSIS — Z7901 Long term (current) use of anticoagulants: Secondary | ICD-10-CM | POA: Insufficient documentation

## 2014-03-29 DIAGNOSIS — Y846 Urinary catheterization as the cause of abnormal reaction of the patient, or of later complication, without mention of misadventure at the time of the procedure: Secondary | ICD-10-CM | POA: Insufficient documentation

## 2014-03-29 DIAGNOSIS — Z466 Encounter for fitting and adjustment of urinary device: Secondary | ICD-10-CM | POA: Diagnosis present

## 2014-03-29 DIAGNOSIS — T83511A Infection and inflammatory reaction due to indwelling urethral catheter, initial encounter: Secondary | ICD-10-CM

## 2014-03-29 DIAGNOSIS — Z862 Personal history of diseases of the blood and blood-forming organs and certain disorders involving the immune mechanism: Secondary | ICD-10-CM | POA: Diagnosis not present

## 2014-03-29 DIAGNOSIS — Z8679 Personal history of other diseases of the circulatory system: Secondary | ICD-10-CM | POA: Insufficient documentation

## 2014-03-29 DIAGNOSIS — N390 Urinary tract infection, site not specified: Secondary | ICD-10-CM | POA: Diagnosis not present

## 2014-03-29 DIAGNOSIS — Z792 Long term (current) use of antibiotics: Secondary | ICD-10-CM | POA: Diagnosis not present

## 2014-03-29 LAB — BASIC METABOLIC PANEL
Anion gap: 9 (ref 5–15)
BUN: 9 mg/dL (ref 6–23)
CALCIUM: 8.8 mg/dL (ref 8.4–10.5)
CHLORIDE: 107 mmol/L (ref 96–112)
CO2: 23 mmol/L (ref 19–32)
CREATININE: 1.01 mg/dL (ref 0.50–1.35)
GFR calc Af Amer: >90 mL/min (ref >90)
GFR calc non Af Amer: 78 mL/min — ABNORMAL LOW (ref >90)
GLUCOSE: 105 mg/dL — AB (ref 70–99)
Potassium: 4.3 mmol/L (ref 3.5–5.1)
Sodium: 139 mmol/L (ref 135–145)

## 2014-03-29 LAB — CBC WITH DIFFERENTIAL/PLATELET
BASOS PCT: 1 % (ref 0–1)
Basophils Absolute: 0.1 10*3/uL (ref 0.0–0.1)
EOS PCT: 7 % — AB (ref 0–5)
Eosinophils Absolute: 0.6 10*3/uL (ref 0.0–0.7)
HEMATOCRIT: 46.4 % (ref 39.0–52.0)
Hemoglobin: 14.5 g/dL (ref 13.0–17.0)
Lymphocytes Relative: 39 % (ref 12–46)
Lymphs Abs: 3.7 10*3/uL (ref 0.7–4.0)
MCH: 30.3 pg (ref 26.0–34.0)
MCHC: 31.3 g/dL (ref 30.0–36.0)
MCV: 97.1 fL (ref 78.0–100.0)
MONOS PCT: 13 % — AB (ref 3–12)
Monocytes Absolute: 1.2 10*3/uL — ABNORMAL HIGH (ref 0.1–1.0)
Neutro Abs: 3.8 10*3/uL (ref 1.7–7.7)
Neutrophils Relative %: 40 % — ABNORMAL LOW (ref 43–77)
PLATELETS: 430 10*3/uL — AB (ref 150–400)
RBC: 4.78 MIL/uL (ref 4.22–5.81)
RDW: 17.9 % — ABNORMAL HIGH (ref 11.5–15.5)
WBC: 9.4 10*3/uL (ref 4.0–10.5)

## 2014-03-29 LAB — URINALYSIS, ROUTINE W REFLEX MICROSCOPIC
Bilirubin Urine: NEGATIVE
Glucose, UA: NEGATIVE mg/dL
KETONES UR: NEGATIVE mg/dL
Nitrite: NEGATIVE
PROTEIN: 30 mg/dL — AB
Specific Gravity, Urine: 1.004 — ABNORMAL LOW (ref 1.005–1.030)
UROBILINOGEN UA: 1 mg/dL (ref 0.0–1.0)
pH: 6.5 (ref 5.0–8.0)

## 2014-03-29 LAB — URINE MICROSCOPIC-ADD ON

## 2014-03-29 MED ORDER — LEVOFLOXACIN 750 MG PO TABS: 750.0000 mg | ORAL_TABLET | Freq: Every day | ORAL | Status: DC

## 2014-03-29 MED ORDER — ONDANSETRON 4 MG PO TBDP: ORAL_TABLET | ORAL | Status: DC

## 2014-03-29 NOTE — ED Notes (Signed)
Pt reports suprapubic catheter pain x 1 week. States due to be changed at beginning of month, staff says it's due on the 6715. Pt also concerned about UTI. A&O x 4.

## 2014-03-29 NOTE — ED Provider Notes (Signed)
CSN: [id number]     Arrival date & time 03/29/14  1119 History   First MD Initiated Contact with Patient 03/29/14 1125     Chief Complaint  Patient presents with  . catheter complaint      (Consider location/radiation/quality/duration/timing/severity/associated sxs/prior Treatment) HPI Comments: 64 year old male with history of aneurysm repair subarachnoid hemorrhage or paraplegia and suprapubic catheter used to approximate 1 year. Patient presents with intermittent vomiting, general fatigue and requesting catheter change. Patient says he has had vomiting with urine infections in the past. Patient has had pain at the site for the past week as well. Patient says he was supposed to have it changed the first of the month patient denies fevers or chills. Pain fairly constant.  The history is provided by the patient.    Past Medical History  Diagnosis Date  . Aneurysm   . Clotting disorder    History reviewed. No pertinent past surgical history. Family History  Problem Relation Age of Onset  . Lung cancer Mother   . Other Father     lung problems/not sure of cause of death   History  Substance Use Topics  . Smoking status: Never Smoker   . Smokeless tobacco: Never Used  . Alcohol Use: No    Review of Systems  Constitutional: Positive for fatigue. Negative for fever and chills.  HENT: Negative for congestion.   Eyes: Negative for visual disturbance.  Respiratory: Negative for shortness of breath.   Cardiovascular: Negative for chest pain.  Gastrointestinal: Positive for nausea and vomiting.  Genitourinary: Negative for dysuria and flank pain.  Musculoskeletal: Negative for back pain, neck pain and neck stiffness.  Skin: Negative for rash.  Neurological: Negative for light-headedness and headaches.      Allergies  Penicillins and Sulfa antibiotics  Home Medications   Prior to Admission medications   Medication Sig Start Date End Date Taking? Authorizing Provider   acetaminophen (TYLENOL) 325 MG tablet Take 650 mg by mouth every 6 (six) hours as needed for mild pain.    Historical Provider, MD  baclofen (LIORESAL) 10 MG tablet Take 5 mg by mouth daily.    Historical Provider, MD  bisacodyl (DULCOLAX) 10 MG suppository Place 10 mg rectally daily as needed for moderate constipation.    Historical Provider, MD  bisacodyl (DULCOLAX) 5 MG EC tablet Take 5 mg by mouth daily.     Historical Provider, MD  cephALEXin (KEFLEX) 500 MG capsule Take 1 capsule (500 mg total) by mouth 4 (four) times daily. 01/06/14   Nelia Shiobert L Beaton, MD  Cranberry 250 MG CAPS Take 1 capsule by mouth 2 (two) times daily.    Historical Provider, MD  diazepam (VALIUM) 5 MG tablet Take 5 mg by mouth 2 (two) times daily.    Historical Provider, MD  DULoxetine (CYMBALTA) 30 MG capsule Take 30 mg by mouth daily.    Historical Provider, MD  gabapentin (NEURONTIN) 300 MG capsule Take 300 mg by mouth 3 (three) times daily.    Historical Provider, MD  levofloxacin (LEVAQUIN) 750 MG tablet Take 1 tablet (750 mg total) by mouth daily. 03/30/14   Enid SkeensJoshua M Andreah Goheen, MD  LORazepam (ATIVAN) 0.5 MG tablet Take 0.5 mg by mouth daily as needed for anxiety (anxiety).     Historical Provider, MD  morphine (MS CONTIN) 15 MG 12 hr tablet Take 15 mg by mouth 2 (two) times daily.    Historical Provider, MD  mupirocin ointment (BACTROBAN) 2 % Place 1 application into the nose  3 (three) times daily as needed.     Historical Provider, MD  ondansetron (ZOFRAN ODT) 4 MG disintegrating tablet  ODT q4 hours prn nausea/vomit 03/29/14   Enid Skeens, MD  POLYETHYLENE GLYCOL 3350 PO Take 17 g/mL by mouth daily.     Historical Provider, MD  QUEtiapine (SEROQUEL) 50 MG tablet Take 50 mg by mouth 2 (two) times daily.     Historical Provider, MD  ranitidine (ZANTAC) 150 MG tablet Take 150 mg by mouth 2 (two) times daily.    Historical Provider, MD  sertraline (ZOLOFT) 100 MG tablet Take 100 mg by mouth daily.    Historical  Provider, MD  tamsulosin (FLOMAX) 0.4 MG CAPS capsule Take 0.4 mg by mouth at bedtime.     Historical Provider, MD  traMADol (ULTRAM) 50 MG tablet Take 50 mg by mouth every 6 (six) hours as needed for moderate pain (pain).     Historical Provider, MD  Vitamin D, Ergocalciferol, (DRISDOL) 50000 UNITS CAPS capsule Take 50,000 Units by mouth every 7 (seven) days. Wednesday    Historical Provider, MD  warfarin (COUMADIN) 5 MG tablet Take 5 mg by mouth daily.    Historical Provider, MD   BP 114/77 mmHg  Pulse 65  Temp(Src) 98.4 F (36.9 C) (Oral)  Resp 16  SpO2 96% Physical Exam  Constitutional: He is oriented to person, place, and time. He appears well-developed and well-nourished.  HENT:  Head: Normocephalic and atraumatic.  Eyes: Right eye exhibits no discharge. Left eye exhibits no discharge.  Neck: Normal range of motion. Neck supple. No tracheal deviation present.  Cardiovascular: Normal rate and regular rhythm.   Pulmonary/Chest: Effort normal and breath sounds normal.  Abdominal: Soft. He exhibits no distension. There is tenderness (mild at catheter site no drainage or surrounding erythemasuprapubic catheter in place). There is no guarding.  Neurological: He is alert and oriented to person, place, and time.  Skin: Skin is warm.  Nursing note and vitals reviewed.   ED Course  Procedures (including critical care time) Suprapubic catheter exchange. Indication pain at site and patient request, possible infection Using sterile technique and fully catheter was placed suprapubically without difficulty. Urine flowed afterwards without difficulty. Performed by myself, no comp occasions Labs Review Labs Reviewed  URINALYSIS, ROUTINE W REFLEX MICROSCOPIC - Abnormal; Notable for the following:    APPearance CLOUDY (*)    Specific Gravity, Urine 1.004 (*)    Hgb urine dipstick LARGE (*)    Protein, ur 30 (*)    Leukocytes, UA LARGE (*)    All other components within normal limits  CBC  WITH DIFFERENTIAL/PLATELET - Abnormal; Notable for the following:    RDW 17.9 (*)    Platelets 430 (*)    Neutrophils Relative % 40 (*)    Monocytes Relative 13 (*)    Monocytes Absolute 1.2 (*)    Eosinophils Relative 7 (*)    All other components within normal limits  BASIC METABOLIC PANEL - Abnormal; Notable for the following:    Glucose, Bld 105 (*)    GFR calc non Af Amer 78 (*)    All other components within normal limits  URINE CULTURE  URINE MICROSCOPIC-ADD ON    Imaging Review No results found.   EKG Interpretation None      MDM   Final diagnoses:  UTI (urinary tract infection) due to urinary indwelling catheter  Non-intractable vomiting with nausea, vomiting of unspecified type   Patient presents with vomiting and pain at  catheter site. Patient says he has had this in the past with catheter infections. Patient requesting catheter to be changed and will follow-up with primary Dr./urology. Urinalysis and culture ordered for follow-up outpatient. With pain and vomiting patient be treated with antibiotics however requested him to delay starting unless he gets a fever until he sees his doctor. Patient non-toxic-appearing in ER. Nurse assisted me in exchanging suprapubic catheter without difficulty.  Results and differential diagnosis were discussed with the patient/parent/guardian. Close follow up outpatient was discussed, comfortable with the plan.   Medications - No data to display  Filed Vitals:   03/29/14 1122 03/29/14 1343  BP: 103/67 114/77  Pulse: 84 65  Temp: 99 F (37.2 C) 98.4 F (36.9 C)  TempSrc: Oral Oral  Resp: 18 16  SpO2: 95% 96%    Final diagnoses:  UTI (urinary tract infection) due to urinary indwelling catheter  Non-intractable vomiting with nausea, vomiting of unspecified type       Enid Skeens, MD 03/30/14 1726

## 2014-03-29 NOTE — ED Notes (Signed)
[facility] with [facility]. [facility] contacted for transport back to facility. Will attempt to give report again prior to transport.

## 2014-03-29 NOTE — Discharge Instructions (Signed)
Follow-up culture results and follow-up with urology. The antibiotic will have an effect on how thin your blood is, you will need very close monitoring of your INR. Follow-up closely with urology as he may not need the antibiotic for more than 2 days pending her culture result. Take Zofran for persistent vomiting. If you were given medicines take as directed.  If you are on coumadin or contraceptives realize their levels and effectiveness is altered by many different medicines.  If you have any reaction (rash, tongues swelling, other) to the medicines stop taking and see a physician.   Please follow up as directed and return to the ER or see a physician for new or worsening symptoms.  Thank you. Filed Vitals:   03/29/14 1122  BP: 103/67  Pulse: 84  Temp: 99 F (37.2 C)  TempSrc: Oral  Resp: 18  SpO2: 95%

## 2014-03-29 NOTE — ED Notes (Signed)
[facility] RN given report.

## 2014-03-31 LAB — URINE CULTURE: Colony Count: 35000

## 2014-04-01 ENCOUNTER — Telehealth (HOSPITAL_COMMUNITY): Payer: Self-pay

## 2014-04-01 NOTE — Telephone Encounter (Signed)
[facility]  Results faxed to facility @ [phone].

## 2014-04-01 NOTE — Progress Notes (Signed)
ED Antimicrobial Stewardship Positive Culture Follow Up   Daniel Wolfe is an 64 y.o. male who [facility] with a chief complaint of catheter pain  Chief Complaint  Patient presents with  . catheter complaint     Recent Results (from the past 720 hour(s))  Urine culture     Status: None   Collection Time: 03/29/14  1:43 PM  Result Value Ref Range Status   Specimen Description URINE, CATHETERIZED  Final   Special Requests NONE  Final   Colony Count   Final    35,000 COLONIES/ML [facility]formed at [facility]    Culture   Final    PROTEUS MIRABILIS Performed at [facility]    Report Status 03/31/2014 FINAL  Final   Organism ID, Bacteria PROTEUS MIRABILIS  Final      Susceptibility   Proteus mirabilis - MIC*    AMPICILLIN <=2 SENSITIVE Sensitive     CEFAZOLIN 8 SENSITIVE Sensitive     CEFTRIAXONE <=1 SENSITIVE Sensitive     CIPROFLOXACIN >=4 RESISTANT Resistant     GENTAMICIN >=16 RESISTANT Resistant     LEVOFLOXACIN >=8 RESISTANT Resistant     NITROFURANTOIN 128 RESISTANT Resistant     TOBRAMYCIN 8 INTERMEDIATE Intermediate     TRIMETH/SULFA >=320 RESISTANT Resistant     PIP/TAZO <=4 SENSITIVE Sensitive     * PROTEUS MIRABILIS    [x]  Treated with Levaquin, organism resistant to prescribed antimicrobial  63 YOM with suprapubic cathether in place who presented with pain at the catheter site. The patient was noted to be afebrile, WBC wnl. MD gave the patient a prescription for Levaquin with instructions to get filled and start taking if a fever develops. The patient only grew out 35k of proteus that is resistant to Levaquin.  New antibiotic prescription: Instruct patient to not take Levaquin as the organism is resistant and only a small amount grew so not considered infectious. The patient should follow-up with urology.  ED Provider: Sharilyn SitesLisa Sanders, Daniel Wolfe  Daniel Wolfe, Daniel Wolfe 04/01/2014, 9:32 AM Infectious Diseases Pharmacist Phone#  [phone]

## 2014-06-14 ENCOUNTER — Emergency Department (HOSPITAL_COMMUNITY)
Admission: EM | Admit: 2014-06-14 | Discharge: 2014-06-15 | Disposition: A | Discharge status: Home / Self Care | Payer: Medicare Other | Attending: Emergency Medicine | Admitting: Emergency Medicine

## 2014-06-14 ENCOUNTER — Encounter (HOSPITAL_COMMUNITY): Payer: Self-pay | Admitting: Emergency Medicine

## 2014-06-14 DIAGNOSIS — Y846 Urinary catheterization as the cause of abnormal reaction of the patient, or of later complication, without mention of misadventure at the time of the procedure: Secondary | ICD-10-CM | POA: Diagnosis not present

## 2014-06-14 DIAGNOSIS — Z880 Allergy status to penicillin: Secondary | ICD-10-CM | POA: Diagnosis not present

## 2014-06-14 DIAGNOSIS — Z79899 Other long term (current) drug therapy: Secondary | ICD-10-CM | POA: Insufficient documentation

## 2014-06-14 DIAGNOSIS — Z791 Long term (current) use of non-steroidal anti-inflammatories (NSAID): Secondary | ICD-10-CM | POA: Diagnosis not present

## 2014-06-14 DIAGNOSIS — Z792 Long term (current) use of antibiotics: Secondary | ICD-10-CM | POA: Insufficient documentation

## 2014-06-14 DIAGNOSIS — Z862 Personal history of diseases of the blood and blood-forming organs and certain disorders involving the immune mechanism: Secondary | ICD-10-CM | POA: Insufficient documentation

## 2014-06-14 DIAGNOSIS — Z7901 Long term (current) use of anticoagulants: Secondary | ICD-10-CM | POA: Insufficient documentation

## 2014-06-14 DIAGNOSIS — T83091A Other mechanical complication of indwelling urethral catheter, initial encounter: Secondary | ICD-10-CM

## 2014-06-14 DIAGNOSIS — Z8679 Personal history of other diseases of the circulatory system: Secondary | ICD-10-CM | POA: Diagnosis not present

## 2014-06-14 DIAGNOSIS — T83098A Other mechanical complication of other indwelling urethral catheter, initial encounter: Secondary | ICD-10-CM | POA: Diagnosis not present

## 2014-06-14 DIAGNOSIS — R339 Retention of urine, unspecified: Secondary | ICD-10-CM | POA: Diagnosis present

## 2014-06-14 NOTE — ED Notes (Signed)
[facility], has had less output from his suprapubic catheter than usual.  He usually empties his bag about 7 times a day, has not emptied it today.  Patient has had a lot to drink today.  Patient was urinating out of his penis when EMS arrived.

## 2014-06-14 NOTE — ED Notes (Signed)
Pt urinated on the bed. Pt's sheets changed and pt give a urinal.

## 2014-06-15 LAB — URINALYSIS, ROUTINE W REFLEX MICROSCOPIC
BILIRUBIN URINE: NEGATIVE
Glucose, UA: NEGATIVE mg/dL
KETONES UR: NEGATIVE mg/dL
Nitrite: POSITIVE — AB
Protein, ur: NEGATIVE mg/dL
SPECIFIC GRAVITY, URINE: 1.004 — AB (ref 1.005–1.030)
Urobilinogen, UA: 0.2 mg/dL (ref 0.0–1.0)
pH: 6.5 (ref 5.0–8.0)

## 2014-06-15 LAB — URINE MICROSCOPIC-ADD ON

## 2014-06-15 MED ORDER — KETOROLAC TROMETHAMINE 30 MG/ML IJ SOLN: 30.0000 mg | Freq: Once | INTRAMUSCULAR | Status: DC

## 2014-06-15 NOTE — ED Provider Notes (Signed)
CSN: [id number]     Arrival date & time 06/14/14  2306 History  This chart was scribed for Dione Booze, MD by Bronson Curb, ED Scribe. This patient was seen in room A09C/A09C and the patient's care was started at 12:47 AM.   Chief Complaint  Patient presents with  . Urinary Retention    The history is provided by the patient. No language interpreter was used.     HPI Comments: Daniel Wolfe is a 64 y.o. male, brought in by ambulance, who presents to the Emergency Department complaining of bladder spasms in the last 24 hours. Patient reports decreased output from his suprapubic catheter.  He also states he has been urinating from his penis and suspects the catheter may be clogged. He denies any leakage from the catheter. Patient has had catheters for the past 4-5 years and a suprapubic catheter for approximately 11 months.   Past Medical History  Diagnosis Date  . Aneurysm   . Clotting disorder    History reviewed. No pertinent past surgical history. Family History  Problem Relation Age of Onset  . Lung cancer Mother   . Other Father     lung problems/not sure of cause of death   History  Substance Use Topics  . Smoking status: Never Smoker   . Smokeless tobacco: Never Used  . Alcohol Use: No    Review of Systems  Genitourinary: Positive for decreased urine volume.  All other systems reviewed and are negative.     Allergies  Penicillins and Sulfa antibiotics  Home Medications   Prior to Admission medications   Medication Sig Start Date End Date Taking? Authorizing Provider  acetaminophen (TYLENOL) 325 MG tablet Take 650 mg by mouth every 6 (six) hours as needed for mild pain.    Historical Provider, MD  baclofen (LIORESAL) 10 MG tablet Take 5 mg by mouth daily.    Historical Provider, MD  bisacodyl (DULCOLAX) 10 MG suppository Place 10 mg rectally daily as needed for moderate constipation.    Historical Provider, MD  bisacodyl (DULCOLAX) 5 MG EC tablet Take 5 mg by  mouth daily.     Historical Provider, MD  cephALEXin (KEFLEX) 500 MG capsule Take 1 capsule (500 mg total) by mouth 4 (four) times daily. 01/06/14   Nelva Nay, MD  Cranberry 250 MG CAPS Take 1 capsule by mouth 2 (two) times daily.    Historical Provider, MD  diazepam (VALIUM) 5 MG tablet Take 5 mg by mouth 2 (two) times daily.    Historical Provider, MD  DULoxetine (CYMBALTA) 30 MG capsule Take 30 mg by mouth daily.    Historical Provider, MD  gabapentin (NEURONTIN) 300 MG capsule Take 300 mg by mouth 3 (three) times daily.    Historical Provider, MD  levofloxacin (LEVAQUIN) 750 MG tablet Take 1 tablet (750 mg total) by mouth daily. 03/30/14   Blane Ohara, MD  LORazepam (ATIVAN) 0.5 MG tablet Take 0.5 mg by mouth daily as needed for anxiety (anxiety).     Historical Provider, MD  morphine (MS CONTIN) 15 MG 12 hr tablet Take 15 mg by mouth 2 (two) times daily.    Historical Provider, MD  mupirocin ointment (BACTROBAN) 2 % Place 1 application into the nose 3 (three) times daily as needed.     Historical Provider, MD  ondansetron (ZOFRAN ODT) 4 MG disintegrating tablet  ODT q4 hours prn nausea/vomit 03/29/14   Blane Ohara, MD  POLYETHYLENE GLYCOL 3350 PO Take 17 g/mL by mouth  daily.     Historical Provider, MD  QUEtiapine (SEROQUEL) 50 MG tablet Take 50 mg by mouth 2 (two) times daily.     Historical Provider, MD  ranitidine (ZANTAC) 150 MG tablet Take 150 mg by mouth 2 (two) times daily.    Historical Provider, MD  sertraline (ZOLOFT) 100 MG tablet Take 100 mg by mouth daily.    Historical Provider, MD  tamsulosin (FLOMAX) 0.4 MG CAPS capsule Take 0.4 mg by mouth at bedtime.     Historical Provider, MD  traMADol (ULTRAM) 50 MG tablet Take 50 mg by mouth every 6 (six) hours as needed for moderate pain (pain).     Historical Provider, MD  Vitamin D, Ergocalciferol, (DRISDOL) 50000 UNITS CAPS capsule Take 50,000 Units by mouth every 7 (seven) days. Wednesday    Historical Provider, MD  warfarin  (COUMADIN) 5 MG tablet Take 5 mg by mouth daily.    Historical Provider, MD   Triage Vitals: BP 111/71 mmHg  Pulse 86  Temp(Src) 98 F (36.7 C) (Oral)  Resp 18  SpO2 92%  Physical Exam  Constitutional: He is oriented to person, place, and time. He appears well-developed and well-nourished. No distress.  HENT:  Head: Normocephalic and atraumatic.  Eyes: Conjunctivae and EOM are normal. Pupils are equal, round, and reactive to light.  Neck: Normal range of motion. Neck supple. No JVD present.  Cardiovascular: Normal rate, regular rhythm and normal heart sounds.   No murmur heard. Pulmonary/Chest: Effort normal and breath sounds normal. He has no wheezes. He has no rales. He exhibits no tenderness.  Abdominal: Soft. Bowel sounds are normal. He exhibits no distension and no mass. There is no tenderness.  Suprapubic catheter present.  Musculoskeletal: Normal range of motion. He exhibits no edema.  Lymphadenopathy:    He has no cervical adenopathy.  Neurological: He is alert and oriented to person, place, and time. No cranial nerve deficit. He exhibits normal muscle tone. Coordination normal.  Skin: Skin is warm and dry. No rash noted.  Psychiatric: He has a normal mood and affect. His behavior is normal. Thought content normal.  Nursing note and vitals reviewed.   ED Course  Procedures (including critical care time)  DIAGNOSTIC STUDIES: Oxygen Saturation is 92% on room air, adequate by my interpretation.    COORDINATION OF CARE: At [id number] Discussed treatment plan with patient which includes labs. Patient agrees.   Labs Review Results for orders placed or performed during the hospital encounter of 06/14/14  Urinalysis, Routine w reflex microscopic  Result Value Ref Range   Color, Urine STRAW (A) YELLOW   APPearance CLOUDY (A) CLEAR   Specific Gravity, Urine 1.004 (L) 1.005 - 1.030   pH 6.5 5.0 - 8.0   Glucose, UA NEGATIVE NEGATIVE mg/dL   Hgb urine dipstick MODERATE (A)  NEGATIVE   Bilirubin Urine NEGATIVE NEGATIVE   Ketones, ur NEGATIVE NEGATIVE mg/dL   Protein, ur NEGATIVE NEGATIVE mg/dL   Urobilinogen, UA 0.2 0.0 - 1.0 mg/dL   Nitrite POSITIVE (A) NEGATIVE   Leukocytes, UA LARGE (A) NEGATIVE  Urine microscopic-add on  Result Value Ref Range   Squamous Epithelial / LPF FEW (A) RARE   WBC, UA 21-50 <3 WBC/hpf   RBC / HPF 3-6 <3 RBC/hpf   Bacteria, UA MANY (A) RARE   Foley catheter was removed from the suprapubic site and new 18 French catheter inserted without difficulty. There was brisk flow of urine upon placement of new catheter.  MDM   Final  diagnoses:  Obstructed Foley catheter, initial encounter    Obstructed suprapubic Foley catheter. Following change of catheter, there was normal flow of urine and he is discharged back to his nursing home.  I personally performed the services described in this documentation, which was scribed in my presence. The recorded information has been reviewed and is accurate.     Dione Booze, MD 06/15/14 Lyda Jester

## 2014-06-15 NOTE — Discharge Instructions (Signed)
Foley Catheter Care °A Foley catheter is a soft, flexible tube that is placed into the bladder to drain urine. A Foley catheter may be inserted if: °· You leak urine or are not able to control when you urinate (urinary incontinence). °· You are not able to urinate when you need to (urinary retention). °· You had prostate surgery or surgery on the genitals. °· You have certain medical conditions, such as multiple sclerosis, dementia, or a spinal cord injury. °If you are going home with a Foley catheter in place, follow the instructions below. °TAKING CARE OF THE CATHETER °1. Wash your hands with soap and water. °2. Using mild soap and warm water on a clean washcloth: °· Clean the area on your body closest to the catheter insertion site using a circular motion, moving away from the catheter. Never wipe toward the catheter because this could sweep bacteria up into the urethra and cause infection. °· Remove all traces of soap. Pat the area dry with a clean towel. For males, reposition the foreskin. °3. Attach the catheter to your leg so there is no tension on the catheter. Use adhesive tape or a leg strap. If you are using adhesive tape, remove any sticky residue left behind by the previous tape you used. °4. Keep the drainage bag below the level of the bladder, but keep it off the floor. °5. Check throughout the day to be sure the catheter is working and urine is draining freely. Make sure the tubing does not become kinked. °6. Do not pull on the catheter or try to remove it. Pulling could damage internal tissues. °TAKING CARE OF THE DRAINAGE BAGS °You will be given two drainage bags to take home. One is a large overnight drainage bag, and the other is a smaller leg bag that fits underneath clothing. You may wear the overnight bag at any time, but you should never wear the smaller leg bag at night. Follow the instructions below for how to empty, change, and clean your drainage bags. °Emptying the Drainage Bag °You must  empty your drainage bag when it is  -½ full or at least 2-3 times a day. °1. Wash your hands with soap and water. °2. Keep the drainage bag below your hips, below the level of your bladder. This stops urine from going back into the tubing and into your bladder. °3. Hold the dirty bag over the toilet or a clean container. °4. Open the pour spout at the bottom of the bag and empty the urine into the toilet or container. Do not let the pour spout touch the toilet, container, or any other surface. Doing so can place bacteria on the bag, which can cause an infection. °5. Clean the pour spout with a gauze pad or cotton ball that has rubbing alcohol on it. °6. Close the pour spout. °7. Attach the bag to your leg with adhesive tape or a leg strap. °8. Wash your hands well. °Changing the Drainage Bag °Change your drainage bag once a month or sooner if it starts to smell bad or look dirty. Below are steps to follow when changing the drainage bag. °1. Wash your hands with soap and water. °2. Pinch off the rubber catheter so that urine does not spill out. °3. Disconnect the catheter tube from the drainage tube at the connection valve. Do not let the tubes touch any surface. °4. Clean the end of the catheter tube with an alcohol wipe. Use a different alcohol wipe to clean the   end of the drainage tube. °5. Connect the catheter tube to the drainage tube of the clean drainage bag. °6. Attach the new bag to the leg with adhesive tape or a leg strap. Avoid attaching the new bag too tightly. °7. Wash your hands well. °Cleaning the Drainage Bag °1. Wash your hands with soap and water. °2. Wash the bag in warm, soapy water. °3. Rinse the bag thoroughly with warm water. °4. Fill the bag with a solution of white vinegar and water (1 cup vinegar to 1 qt warm water [.2 L vinegar to 1 L warm water]). Close the bag and soak it for 30 minutes in the solution. °5. Rinse the bag with warm water. °6. Hang the bag to dry with the pour spout open  and hanging downward. °7. Store the clean bag (once it is dry) in a clean plastic bag. °8. Wash your hands well. °PREVENTING INFECTION °· Wash your hands before and after handling your catheter. °· Take showers daily and wash the area where the catheter enters your body. Do not take baths. Replace wet leg straps with dry ones, if this applies. °· Do not use powders, sprays, or lotions on the genital area. Only use creams, lotions, or ointments as directed by your caregiver. °· For females, wipe from front to back after each bowel movement. °· Drink enough fluids to keep your urine clear or pale yellow unless you have a fluid restriction. °· Do not let the drainage bag or tubing touch or lie on the floor. °· Wear cotton underwear to absorb moisture and to keep your skin drier. °SEEK MEDICAL CARE IF:  °· Your urine is cloudy or smells unusually bad. °· Your catheter becomes clogged. °· You are not draining urine into the bag or your bladder feels full. °· Your catheter starts to leak. °SEEK IMMEDIATE MEDICAL CARE IF:  °· You have pain, swelling, redness, or pus where the catheter enters the body. °· You have pain in the abdomen, legs, lower back, or bladder. °· You have a fever. °· You see blood fill the catheter, or your urine is pink or red. °· You have nausea, vomiting, or chills. °· Your catheter gets pulled out. °MAKE SURE YOU:  °· Understand these instructions. °· Will watch your condition. °· Will get help right away if you are not doing well or get worse. °Document Released: 02/04/2005 Document Revised: 06/21/2013 Document Reviewed: 01/27/2012 °[employer]® Patient Information ©2015 [employer]. This information is not intended to replace advice given to you by your health care provider. Make sure you discuss any questions you have with your health care provider. ° °

## 2014-06-15 NOTE — ED Notes (Signed)
Preston FleetingGlick, MD turned off the pt's O2 to see his RA sats. Pt is currently satting 96% on room air.

## 2014-06-15 NOTE — ED Notes (Signed)
Per Preston FleetingGlick, MD pt placed on 2L O2. Pt will need to be on oxygen at the facility.

## 2014-06-15 NOTE — ED Notes (Signed)
Pt again desatted to 88%. Daniel FleetingGlick, MD notified and would like to have to pt stay off oxygen to see if the pt's sats come back up on their own.

## 2014-06-15 NOTE — ED Notes (Signed)
Discharged instruction given/explained by Delos HaringAnna L. RN

## 2014-06-15 NOTE — ED Notes (Signed)
Pt desatted to 87%. This RN placed the pt on 2L nasal cannula, and the pt's sats increased to 96%. Preston FleetingGlick, MD notified and is at bedside.

## 2014-06-23 ENCOUNTER — Emergency Department (HOSPITAL_COMMUNITY): Payer: Medicare Other

## 2014-06-23 ENCOUNTER — Encounter (HOSPITAL_COMMUNITY): Payer: Self-pay | Admitting: Emergency Medicine

## 2014-06-23 ENCOUNTER — Emergency Department (HOSPITAL_COMMUNITY)
Admission: EM | Admit: 2014-06-23 | Discharge: 2014-06-24 | Disposition: A | Discharge status: Home / Self Care | Payer: Medicare Other | Attending: Emergency Medicine | Admitting: Emergency Medicine

## 2014-06-23 DIAGNOSIS — Z880 Allergy status to penicillin: Secondary | ICD-10-CM | POA: Diagnosis not present

## 2014-06-23 DIAGNOSIS — Z7901 Long term (current) use of anticoagulants: Secondary | ICD-10-CM | POA: Insufficient documentation

## 2014-06-23 DIAGNOSIS — M545 Low back pain, unspecified: Secondary | ICD-10-CM

## 2014-06-23 DIAGNOSIS — Y9389 Activity, other specified: Secondary | ICD-10-CM | POA: Diagnosis not present

## 2014-06-23 DIAGNOSIS — D689 Coagulation defect, unspecified: Secondary | ICD-10-CM | POA: Diagnosis not present

## 2014-06-23 DIAGNOSIS — W01198A Fall on same level from slipping, tripping and stumbling with subsequent striking against other object, initial encounter: Secondary | ICD-10-CM | POA: Diagnosis not present

## 2014-06-23 DIAGNOSIS — Y9289 Other specified places as the place of occurrence of the external cause: Secondary | ICD-10-CM | POA: Insufficient documentation

## 2014-06-23 DIAGNOSIS — Z8679 Personal history of other diseases of the circulatory system: Secondary | ICD-10-CM | POA: Diagnosis not present

## 2014-06-23 DIAGNOSIS — S6992XA Unspecified injury of left wrist, hand and finger(s), initial encounter: Secondary | ICD-10-CM | POA: Diagnosis present

## 2014-06-23 DIAGNOSIS — W19XXXA Unspecified fall, initial encounter: Secondary | ICD-10-CM

## 2014-06-23 DIAGNOSIS — Z79899 Other long term (current) drug therapy: Secondary | ICD-10-CM | POA: Insufficient documentation

## 2014-06-23 DIAGNOSIS — Y998 Other external cause status: Secondary | ICD-10-CM | POA: Diagnosis not present

## 2014-06-23 DIAGNOSIS — S63502A Unspecified sprain of left wrist, initial encounter: Secondary | ICD-10-CM | POA: Insufficient documentation

## 2014-06-23 DIAGNOSIS — M25532 Pain in left wrist: Secondary | ICD-10-CM | POA: Diagnosis not present

## 2014-06-23 DIAGNOSIS — S3992XA Unspecified injury of lower back, initial encounter: Secondary | ICD-10-CM | POA: Diagnosis not present

## 2014-06-23 MED ORDER — HYDROCODONE-ACETAMINOPHEN 5-325 MG PO TABS
1.0000 | ORAL_TABLET | Freq: Once | ORAL | Status: AC
  Administered 2014-06-23: 1 via ORAL
  Filled 2014-06-23: qty 1

## 2014-06-23 NOTE — ED Provider Notes (Signed)
CSN: [id number]     Arrival date & time 06/23/14  2239 History  This chart was scribed for Pricilla LovelessScott Noreene Boreman, MD by Freida Busmaniana Omoyeni, ED Scribe. This patient was seen in room D34C/D34C and the patient's care was started 11:22 PM.    Chief Complaint  Patient presents with  . Fall     The history is provided by the patient. No language interpreter was used.     HPI Comments:  Hinda LenisRoy D Mcclure is a 64 y.o. male who presents to the Emergency Department s/p fall PTA complaining of constant lower back and left wrist pain following the incident. Pt states the pain to his wrist is greater and rates the pain  A 9/10. Pt was getting out of his car this evening when his BLE "gave out" and he fell landing on the gravel. He notes he landed on his bottom and wrist. He reports a h/o similar episodes since having a brain aneurysm in January 2008. He denies head injury, LOC, CP and acute weakness. No alleviating factors noted.    Past Medical History  Diagnosis Date  . Aneurysm   . Clotting disorder    History reviewed. No pertinent past surgical history. Family History  Problem Relation Age of Onset  . Lung cancer Mother   . Other Father     lung problems/not sure of cause of death   History  Substance Use Topics  . Smoking status: Never Smoker   . Smokeless tobacco: Never Used  . Alcohol Use: No    Review of Systems  Respiratory: Negative for shortness of breath.   Cardiovascular: Negative for chest pain.  Musculoskeletal: Positive for myalgias, back pain and arthralgias.  Neurological: Negative for dizziness, weakness and headaches.  All other systems reviewed and are negative.     Allergies  Penicillins and Sulfa antibiotics  Home Medications   Prior to Admission medications   Medication Sig Start Date End Date Taking? Authorizing Provider  acetaminophen (TYLENOL) 325 MG tablet Take 650 mg by mouth every 4 (four) hours as needed for mild pain.    Yes Historical Provider, MD  baclofen  (LIORESAL) 10 MG tablet Take 5 mg by mouth daily.   Yes Historical Provider, MD  bisacodyl (DULCOLAX) 10 MG suppository Place 10 mg rectally daily as needed for moderate constipation.   Yes Historical Provider, MD  bisacodyl (DULCOLAX) 5 MG EC tablet Take 5 mg by mouth daily.    Yes Historical Provider, MD  Cranberry 250 MG CAPS Take 1 capsule by mouth 2 (two) times daily.   Yes Historical Provider, MD  diazepam (VALIUM) 5 MG tablet Take 5 mg by mouth 2 (two) times daily.   Yes Historical Provider, MD  DULoxetine (CYMBALTA) 30 MG capsule Take 30 mg by mouth 2 (two) times daily.    Yes Historical Provider, MD  fesoterodine (TOVIAZ) 8 MG TB24 tablet Take 8 mg by mouth daily.   Yes Historical Provider, MD  gabapentin (NEURONTIN) 600 MG tablet Take 600 mg by mouth 3 (three) times daily.   Yes Historical Provider, MD  loperamide (IMODIUM A-D) 2 MG tablet Take 2 mg by mouth as needed for diarrhea or loose stools.   Yes Historical Provider, MD  LORazepam (ATIVAN) 0.5 MG tablet Take 0.5 mg by mouth daily as needed for anxiety (anxiety).    Yes Historical Provider, MD  mirabegron ER (MYRBETRIQ) 50 MG TB24 tablet Take 50 mg by mouth daily.   Yes Historical Provider, MD  mupirocin ointment Idelle Jo(BACTROBAN)  2 % Place 1 application into the nose 3 (three) times daily as needed (to supra pubic area to prevent infection).    Yes Historical Provider, MD  ondansetron (ZOFRAN ODT) 4 MG disintegrating tablet 4mg  ODT q4 hours prn nausea/vomit Patient taking differently: Take 4 mg by mouth every 4 (four) hours as needed for nausea or vomiting.  03/29/14  Yes [name], MD  POLYETHYLENE GLYCOL 3350 PO Take 17 g/mL by mouth daily.    Yes Historical Provider, MD  QUEtiapine (SEROQUEL) 50 MG tablet Take 50 mg by mouth 2 (two) times daily.    Yes Historical Provider, MD  ranitidine (ZANTAC) 150 MG tablet Take 150 mg by mouth 2 (two) times daily.   Yes Historical Provider, MD  sertraline (ZOLOFT) 100 MG tablet Take 100 mg by  mouth daily.   Yes Historical Provider, MD  tamsulosin (FLOMAX) 0.4 MG CAPS capsule Take 0.4 mg by mouth at bedtime.    Yes Historical Provider, MD  traMADol (ULTRAM) 50 MG tablet Take 50 mg by mouth every 6 (six) hours as needed for moderate pain (pain).    Yes Historical Provider, MD  Vitamin D, Ergocalciferol, (DRISDOL) 50000 UNITS CAPS capsule Take 50,000 Units by mouth every 7 (seven) days. Saturday   Yes Historical Provider, MD  warfarin (COUMADIN) 4 MG tablet Take 4 mg by mouth daily.   Yes Historical Provider, MD  cephALEXin (KEFLEX) 500 MG capsule Take 1 capsule (500 mg total) by mouth 4 (four) times daily. Patient not taking: Reported on 06/23/2014 01/06/14   [name], MD  levofloxacin (LEVAQUIN) 750 MG tablet Take 1 tablet (750 mg total) by mouth daily. Patient not taking: Reported on 06/23/2014 03/30/14   [name], MD   BP 99/66 mmHg  Pulse 77  Temp(Src) 97.7 F (36.5 C) (Oral)  Resp 16  Ht 5\' 10"  (1.778 m)  Wt 218 lb (98.884 kg)  BMI 31.28 kg/m2  SpO2 92% Physical Exam  Constitutional: He is oriented to person, place, and time. He appears well-developed and well-nourished.  HENT:  Head: Normocephalic and atraumatic.  Right Ear: External ear normal.  Left Ear: External ear normal.  Nose: Nose normal.  Eyes: Right eye exhibits no discharge. Left eye exhibits no discharge.  Neck: Neck supple.  Cardiovascular: Normal rate, regular rhythm, normal heart sounds and intact distal pulses.   Pulmonary/Chest: Effort normal.  Abdominal: Soft. There is no tenderness.  Musculoskeletal: He exhibits no edema.  Left medial wrist swelling with tenderness; decreased ROM of the left wrist Mild midline lumbar TTP No step offs or crepitus  Neurological: He is alert and oriented to person, place, and time.  Equal strength in all 4 extremities. Patient has diffuse weakness that is equal, he states this is his normal  Skin: Skin is warm and dry.  Nursing note and vitals  reviewed.   ED Course  Procedures   DIAGNOSTIC STUDIES:  Oxygen Saturation is 93% on RA, low by my interpretation.    COORDINATION OF CARE:  11:28 PM Discussed treatment plan with pt at bedside and pt agreed to plan.  1:04 AM Pt updated with results.    Labs Review Labs Reviewed - No data to display  Imaging Review Dg Lumbar Spine Complete  06/24/2014   CLINICAL DATA:  Pt states his legs just gave out from underneath him and he fell on gravel. Reports low back pain.  EXAM: LUMBAR SPINE - COMPLETE 4+ VIEW  COMPARISON:  CT 05/16/2013  FINDINGS: Normal alignment of  lumbar vertebral bodies. No loss of vertebral body height or disc height. No pars fracture. No subluxation.  IMPRESSION: No radiographic evidence lumbar spine injury.   Electronically Signed   By: Genevive Bi M.D.   On: 06/24/2014 00:52   Dg Wrist Complete Left  06/23/2014   CLINICAL DATA:  Larey Seat on gravel, landing on outstretched left hand  EXAM: LEFT WRIST - COMPLETE 3+ VIEW  COMPARISON:  None.  FINDINGS: There is no evidence of fracture or dislocation. There is no evidence of arthropathy or other focal bone abnormality. Soft tissues are unremarkable.  IMPRESSION: Negative.   Electronically Signed   By: Ellery Plunk M.D.   On: 06/23/2014 23:48     EKG Interpretation None      MDM   Final diagnoses:  Fall, initial encounter  Left wrist sprain, initial encounter  Midline low back pain without sciatica    Patient with a fall after recurrent transient leg weakness. He states that his legs "give out on him" frequently since his aneurysm several years ago. No new neurologic deficits per patient. X-rays are unremarkable. Did not hit head or neck. Stable for discharge back to facility.  I personally performed the services described in this documentation, which was scribed in my presence. The recorded information has been reviewed and is accurate.    Pricilla Loveless, MD 06/24/14 [phone]

## 2014-06-23 NOTE — ED Notes (Signed)
Per EMS, pt is from [facility]. Pt was getting out of his car today, when he fell on his bottom. Pt attempted to catch himself with his left hand. Pt denies hitting his head. Pt reports left wrist pain and lower back pain.

## 2014-06-24 ENCOUNTER — Encounter (HOSPITAL_COMMUNITY): Payer: Self-pay | Admitting: Nurse Practitioner

## 2014-06-24 ENCOUNTER — Emergency Department (HOSPITAL_COMMUNITY)
Admission: EM | Admit: 2014-06-24 | Discharge: 2014-06-24 | Disposition: A | Discharge status: Home / Self Care | Payer: Medicare Other | Attending: Emergency Medicine | Admitting: Emergency Medicine

## 2014-06-24 DIAGNOSIS — Z862 Personal history of diseases of the blood and blood-forming organs and certain disorders involving the immune mechanism: Secondary | ICD-10-CM | POA: Insufficient documentation

## 2014-06-24 DIAGNOSIS — Z880 Allergy status to penicillin: Secondary | ICD-10-CM | POA: Diagnosis not present

## 2014-06-24 DIAGNOSIS — Z8679 Personal history of other diseases of the circulatory system: Secondary | ICD-10-CM | POA: Insufficient documentation

## 2014-06-24 DIAGNOSIS — M25532 Pain in left wrist: Secondary | ICD-10-CM | POA: Diagnosis present

## 2014-06-24 DIAGNOSIS — Z87440 Personal history of urinary (tract) infections: Secondary | ICD-10-CM | POA: Insufficient documentation

## 2014-06-24 DIAGNOSIS — Z79899 Other long term (current) drug therapy: Secondary | ICD-10-CM | POA: Diagnosis not present

## 2014-06-24 DIAGNOSIS — M549 Dorsalgia, unspecified: Secondary | ICD-10-CM | POA: Diagnosis not present

## 2014-06-24 DIAGNOSIS — Z7901 Long term (current) use of anticoagulants: Secondary | ICD-10-CM | POA: Diagnosis not present

## 2014-06-24 MED ORDER — OXYCODONE-ACETAMINOPHEN 5-325 MG PO TABS: 1.0000 | ORAL_TABLET | Freq: Four times a day (QID) | ORAL | Status: DC | PRN

## 2014-06-24 MED ORDER — OXYCODONE-ACETAMINOPHEN 5-325 MG PO TABS
2.0000 | ORAL_TABLET | Freq: Once | ORAL | Status: AC
  Administered 2014-06-24: 2 via ORAL
  Filled 2014-06-24: qty 2

## 2014-06-24 NOTE — Progress Notes (Signed)
Orthopedic Tech Progress Note Patient Details:  Daniel Wolfe Sep 02, 1950 [id number]  Ortho Devices Type of Ortho Device: Ace wrap, Arm sling, Thumb spica splint Ortho Device/Splint Location: lue Ortho Device/Splint Interventions: Application   Smriti Barkow 06/24/2014, 3:33 PM

## 2014-06-24 NOTE — ED Provider Notes (Signed)
CSN: [id number]     Arrival date & time 06/24/14  1420 History  This chart was scribed for Daniel BleacherJosh Aquinnah Devin, PA-C working with Benjiman CoreNathan Pickering, MD by Elveria Risingimelie Horne, ED Scribe. This patient was seen in room TR10C/TR10C and the patient's care was started at 2:49 PM.   Chief Complaint  Patient presents with  . Wrist Injury   The history is provided by the patient. No language interpreter was used.   HPI Comments: Daniel LenisRoy D Wolfe is a 64 y.o. male who presents to the Emergency Department complaining of worsening swelling and redness to his left wrist injury incurred last night. Patient evaluated in ED following a fall in a gravel parking lot when attempting to get out of his car. Patient reports falling backwards and landing on his buttocks and left wrist. Imaging of back and left wrist performed which resulted as negative for fracture. Patient diagnosed with left wrist sprain and discharged home with instructions to follow up with PCP. Patient has returned this afternoon reporting increased swelling to posterior wrist extending into dorsum of left hand, redness, and pain with radiation into his forearm. Patient reports decreased movement due to pain severity especially with flexion and extension. Patient denies elbow involvement. Patient shares history of osteoarthritis in his lower legs bilaterally. Patient denies history of gout or rheumatoid arthritis. Patient only notes mild pain in his back from his fall. Patient reports treatment with Tramadol, at home medication, without relief.   Patient with history of chronic UTIs with Foley catheter placement. Patient not on long term antibiotics stating he is allergic to penicillin.   Past Medical History  Diagnosis Date  . Aneurysm   . Clotting disorder    History reviewed. No pertinent past surgical history. Family History  Problem Relation Age of Onset  . Lung cancer Mother   . Other Father     lung problems/not sure of cause of death   History   Substance Use Topics  . Smoking status: Never Smoker   . Smokeless tobacco: Never Used  . Alcohol Use: No    Review of Systems  Constitutional: Positive for activity change. Negative for fever.  HENT: Negative for rhinorrhea and sore throat.   Eyes: Negative for redness.  Respiratory: Negative for cough.   Cardiovascular: Negative for chest pain.  Gastrointestinal: Negative for nausea, vomiting, abdominal pain and diarrhea.  Genitourinary: Negative for hematuria.  Musculoskeletal: Positive for back pain, joint swelling and arthralgias. Negative for myalgias, gait problem and neck pain.  Skin: Positive for color change. Negative for rash and wound.  Neurological: Negative for weakness, numbness and headaches.   Allergies  Penicillins and Sulfa antibiotics  Home Medications   Prior to Admission medications   Medication Sig Start Date End Date Taking? Authorizing Provider  acetaminophen (TYLENOL) 325 MG tablet Take 650 mg by mouth every 4 (four) hours as needed for mild pain.     Historical Provider, MD  baclofen (LIORESAL) 10 MG tablet Take 5 mg by mouth daily.    Historical Provider, MD  bisacodyl (DULCOLAX) 10 MG suppository Place 10 mg rectally daily as needed for moderate constipation.    Historical Provider, MD  bisacodyl (DULCOLAX) 5 MG EC tablet Take 5 mg by mouth daily.     Historical Provider, MD  cephALEXin (KEFLEX) 500 MG capsule Take 1 capsule (500 mg total) by mouth 4 (four) times daily. Patient not taking: Reported on 06/23/2014 01/06/14   [name], MD  Cranberry 250 MG CAPS Take 1 capsule  by mouth 2 (two) times daily.    Historical Provider, MD  diazepam (VALIUM) 5 MG tablet Take 5 mg by mouth 2 (two) times daily.    Historical Provider, MD  DULoxetine (CYMBALTA) 30 MG capsule Take 30 mg by mouth 2 (two) times daily.     Historical Provider, MD  fesoterodine (TOVIAZ) 8 MG TB24 tablet Take 8 mg by mouth daily.    Historical Provider, MD  gabapentin (NEURONTIN) 600  MG tablet Take 600 mg by mouth 3 (three) times daily.    Historical Provider, MD  levofloxacin (LEVAQUIN) 750 MG tablet Take 1 tablet (750 mg total) by mouth daily. Patient not taking: Reported on 06/23/2014 03/30/14   [name], MD  loperamide (IMODIUM A-D) 2 MG tablet Take 2 mg by mouth as needed for diarrhea or loose stools.    Historical Provider, MD  LORazepam (ATIVAN) 0.5 MG tablet Take 0.5 mg by mouth daily as needed for anxiety (anxiety).     Historical Provider, MD  mirabegron ER (MYRBETRIQ) 50 MG TB24 tablet Take 50 mg by mouth daily.    Historical Provider, MD  mupirocin ointment (BACTROBAN) 2 % Place 1 application into the nose 3 (three) times daily as needed (to supra pubic area to prevent infection).     Historical Provider, MD  ondansetron (ZOFRAN ODT) 4 MG disintegrating tablet 4mg  ODT q4 hours prn nausea/vomit Patient taking differently: Take 4 mg by mouth every 4 (four) hours as needed for nausea or vomiting.  03/29/14   [name], MD  POLYETHYLENE GLYCOL 3350 PO Take 17 g/mL by mouth daily.     Historical Provider, MD  QUEtiapine (SEROQUEL) 50 MG tablet Take 50 mg by mouth 2 (two) times daily.     Historical Provider, MD  ranitidine (ZANTAC) 150 MG tablet Take 150 mg by mouth 2 (two) times daily.    Historical Provider, MD  sertraline (ZOLOFT) 100 MG tablet Take 100 mg by mouth daily.    Historical Provider, MD  tamsulosin (FLOMAX) 0.4 MG CAPS capsule Take 0.4 mg by mouth at bedtime.     Historical Provider, MD  traMADol (ULTRAM) 50 MG tablet Take 50 mg by mouth every 6 (six) hours as needed for moderate pain (pain).     Historical Provider, MD  Vitamin D, Ergocalciferol, (DRISDOL) 50000 UNITS CAPS capsule Take 50,000 Units by mouth every 7 (seven) days. Saturday    Historical Provider, MD  warfarin (COUMADIN) 4 MG tablet Take 4 mg by mouth daily.    Historical Provider, MD   Triage Vitals: BP 138/84 mmHg  Pulse 94  Temp(Src) 97.9 F (36.6 C) (Oral)  Resp 15  SpO2  100% Physical Exam  Constitutional: He is oriented to person, place, and time. He appears well-developed and well-nourished. No distress.  HENT:  Head: Normocephalic and atraumatic.  Eyes: Conjunctivae and EOM are normal.  Neck: Normal range of motion. Neck supple. No tracheal deviation present.  Cardiovascular: Normal rate and normal pulses.   Pulmonary/Chest: Effort normal. No respiratory distress.  Musculoskeletal: He exhibits tenderness. He exhibits no edema.       Left shoulder: Normal.       Left elbow: Normal.       Left wrist: He exhibits decreased range of motion, tenderness, bony tenderness and swelling. He exhibits no deformity and no laceration.       Left forearm: Normal.       Left hand: Normal.  Patient holds wrist over lower chest in position of comfort.  Pain with even slight movement of wrist. Mild erythema and warmth over dorsum of wrist. 2+ radial pulse. ++ anatomic snuff box tenderness.   Neurological: He is alert and oriented to person, place, and time. No sensory deficit.  Motor, sensation, and vascular distal to the injury is fully intact.   Skin: Skin is warm and dry.  Psychiatric: He has a normal mood and affect. His behavior is normal.  Nursing note and vitals reviewed.   ED Course  Procedures (including critical care time)  COORDINATION OF CARE: 2:54 PM- Discussed treatment plan with patient at bedside and patient agreed to plan.   Labs Review Labs Reviewed - No data to display  Imaging Review Dg Lumbar Spine Complete  06/24/2014   CLINICAL DATA:  Pt states his legs just gave out from underneath him and he fell on gravel. Reports low back pain.  EXAM: LUMBAR SPINE - COMPLETE 4+ VIEW  COMPARISON:  CT 05/16/2013  FINDINGS: Normal alignment of lumbar vertebral bodies. No loss of vertebral body height or disc height. No pars fracture. No subluxation.  IMPRESSION: No radiographic evidence lumbar spine injury.   Electronically Signed   By: Genevive Bi M.D.    On: 06/24/2014 00:52   Dg Wrist Complete Left  06/23/2014   CLINICAL DATA:  Larey Seat on gravel, landing on outstretched left hand  EXAM: LEFT WRIST - COMPLETE 3+ VIEW  COMPARISON:  None.  FINDINGS: There is no evidence of fracture or dislocation. There is no evidence of arthropathy or other focal bone abnormality. Soft tissues are unremarkable.  IMPRESSION: Negative.   Electronically Signed   By: Ellery Plunk M.D.   On: 06/23/2014 23:48     EKG Interpretation None      Vital signs reviewed and are as follows: Filed Vitals:   06/24/14 1428  BP: 138/84  Pulse: 94  Temp: 97.9 F (36.6 C)  Resp: 15    Patient discussed with and seen by Dr. Rubin Payor. Thumb spica by ortho tech. Recc ortho hand follow-up to ensure no navicular fracture.   Rx percocet for home. Directed to use pain medication only under direct supervision at the lowest possible dose needed to control your pain. Patient counseled on use of narcotic pain medications. Counseled not to combine these medications with others containing tylenol. Urged not to drink alcohol, drive, or perform any other activities that requires focus while taking these medications. The patient verbalizes understanding and agrees with the plan.    MDM   Final diagnoses:  Left wrist pain   L wrist pain after trauma, neg x-rays but anatomic snuff box is tender. Considered inflammatory arthritis or cellulitis but trauma is most obvious cause of current symptoms. No systemic symptoms or any recent symptoms c/w septic arthritis. Pt does have indwelling suprapubic cath but doubt hematologous spread from GU tract. Pt seen with attending who was involved with formulating plan and tx.   I personally performed the services described in this documentation, which was scribed in my presence. The recorded information has been reviewed and is accurate.    Renne Crigler, PA-C 06/24/14 1654  Benjiman Core, MD 06/25/14 [id number]

## 2014-06-24 NOTE — ED Notes (Signed)
He was seen here last night after a fall and L wrist injury. He was sent home with diagnosis of wrist sprain. The pain is worse and his wrist is more red and swollen today\. He took tramadol with no relief. Pulses intact, cap refill <2.

## 2014-06-24 NOTE — Discharge Instructions (Signed)
Please read and follow all provided instructions.  Your diagnoses today include:  1. Left wrist pain    Tests performed today include:  Vital signs. See below for your results today.   Medications prescribed:   Percocet (oxycodone/acetaminophen) - narcotic pain medication  DO NOT drive or perform any activities that require you to be awake and alert because this medicine can make you drowsy. BE VERY CAREFUL not to take multiple medicines containing Tylenol (also called acetaminophen). Doing so can lead to an overdose which can damage your liver and cause liver failure and possibly death.  Take any prescribed medications only as directed.  Home care instructions:   Follow any educational materials contained in this packet  Follow R.I.C.E. Protocol:  R - rest your injury   I  - use ice on injury without applying directly to skin  C - compress injury with bandage or splint  E - elevate the injury as much as possible  Follow-up instructions: Please follow-up with the provided orthopedic physician (bone specialist) in 5 days.   Return instructions:   Please return if your fingers are numb or tingling, appear gray or blue, or you have severe pain (also elevate the arm and loosen splint or wrap if you were given one)  Please return to the Emergency Department if you experience worsening symptoms.   Please return if you have any other emergent concerns.  Additional Information:  Your vital signs today were: BP 138/84 mmHg   Pulse 94   Temp(Src) 97.9 F (36.6 C) (Oral)   Resp 15   SpO2 100% If your blood pressure (BP) was elevated above 135/85 this visit, please have this repeated by your doctor within one month. --------------

## 2014-06-24 NOTE — ED Notes (Signed)
Pt has multiple chronic conditions, indwelling catheter, in w/c, -- left wrist is red/swollen-- fell a couple days ago-- wrist was not swollen after fall. Dr. Rubin PayorPickering at bedside

## 2014-06-27 ENCOUNTER — Telehealth (HOSPITAL_COMMUNITY): Payer: Self-pay

## 2014-10-18 ENCOUNTER — Telehealth: Payer: Self-pay | Admitting: Family Medicine

## 2014-10-18 NOTE — Telephone Encounter (Signed)
Will forward to MD. Daniel Wolfe,CMA  

## 2014-10-18 NOTE — Telephone Encounter (Signed)
Daniel Wolfe Daniel Wolfe is an Advertising account planner who referred pt to [facility] last year.  Pt was enrolled in [facility] medicare supplement but stopped the plan when the nurse practicioner visits in the assisted living facility were being denied because she was not in the network.  Mr Daniel Wolfe is trying to enroll pt again in the [facility] plan.  Agent wants to know if dr Perley Jain would do the referrals so the services of the nurse practioner would be covered by [facility]. According to [facility], [name] is not a pt of [facility].  Mr Daniel Wolfe is running out of time because in order for pt to have the coverage begin Sept 1, he needs to have an answer by tomorrow midday at the latest.

## 2014-10-19 NOTE — Telephone Encounter (Signed)
LM for Daniel Wolfe with message from Dr. Perley Jain. [name],CMA

## 2014-10-19 NOTE — Telephone Encounter (Signed)
Please inform Daniel Wolfe that Dr Gitty Osterlund is uncomfortable making a referral of a patient whom he nor anyone in the [facility] practice has seen before in our practice.  Daniel Rains will need to be seen for an office visit at the [facility] practice by a physician before a referral could be considered.

## 2014-11-16 ENCOUNTER — Encounter (HOSPITAL_COMMUNITY): Payer: Self-pay | Admitting: Emergency Medicine

## 2014-11-16 ENCOUNTER — Emergency Department (HOSPITAL_COMMUNITY)
Admission: EM | Admit: 2014-11-16 | Discharge: 2014-11-16 | Disposition: A | Discharge status: Home / Self Care | Payer: Commercial Managed Care - HMO | Attending: Emergency Medicine | Admitting: Emergency Medicine

## 2014-11-16 DIAGNOSIS — F419 Anxiety disorder, unspecified: Secondary | ICD-10-CM | POA: Diagnosis not present

## 2014-11-16 DIAGNOSIS — Z880 Allergy status to penicillin: Secondary | ICD-10-CM | POA: Diagnosis not present

## 2014-11-16 DIAGNOSIS — N3001 Acute cystitis with hematuria: Secondary | ICD-10-CM | POA: Diagnosis not present

## 2014-11-16 DIAGNOSIS — Z7901 Long term (current) use of anticoagulants: Secondary | ICD-10-CM | POA: Insufficient documentation

## 2014-11-16 DIAGNOSIS — K219 Gastro-esophageal reflux disease without esophagitis: Secondary | ICD-10-CM | POA: Diagnosis not present

## 2014-11-16 DIAGNOSIS — Z79899 Other long term (current) drug therapy: Secondary | ICD-10-CM | POA: Diagnosis not present

## 2014-11-16 DIAGNOSIS — R319 Hematuria, unspecified: Secondary | ICD-10-CM | POA: Diagnosis present

## 2014-11-16 DIAGNOSIS — Z8679 Personal history of other diseases of the circulatory system: Secondary | ICD-10-CM | POA: Diagnosis not present

## 2014-11-16 DIAGNOSIS — E559 Vitamin D deficiency, unspecified: Secondary | ICD-10-CM | POA: Insufficient documentation

## 2014-11-16 DIAGNOSIS — F329 Major depressive disorder, single episode, unspecified: Secondary | ICD-10-CM | POA: Insufficient documentation

## 2014-11-16 DIAGNOSIS — Z86718 Personal history of other venous thrombosis and embolism: Secondary | ICD-10-CM | POA: Insufficient documentation

## 2014-11-16 DIAGNOSIS — G629 Polyneuropathy, unspecified: Secondary | ICD-10-CM | POA: Insufficient documentation

## 2014-11-16 HISTORY — DX: Vitamin D deficiency, unspecified: E55.9

## 2014-11-16 HISTORY — DX: Anxiety disorder, unspecified: F41.9

## 2014-11-16 HISTORY — DX: Constipation, unspecified: K59.00

## 2014-11-16 HISTORY — DX: Gastro-esophageal reflux disease without esophagitis: K21.9

## 2014-11-16 HISTORY — DX: Major depressive disorder, single episode, unspecified: F32.9

## 2014-11-16 HISTORY — DX: Polyneuropathy, unspecified: G62.9

## 2014-11-16 HISTORY — DX: Retention of urine, unspecified: R33.9

## 2014-11-16 HISTORY — DX: Depression, unspecified: F32.A

## 2014-11-16 LAB — URINALYSIS, ROUTINE W REFLEX MICROSCOPIC
BILIRUBIN URINE: NEGATIVE
Glucose, UA: NEGATIVE mg/dL
KETONES UR: NEGATIVE mg/dL
Nitrite: POSITIVE — AB
PH: 7.5 (ref 5.0–8.0)
Protein, ur: NEGATIVE mg/dL
SPECIFIC GRAVITY, URINE: 1.003 — AB (ref 1.005–1.030)
Urobilinogen, UA: 0.2 mg/dL (ref 0.0–1.0)

## 2014-11-16 LAB — URINE MICROSCOPIC-ADD ON

## 2014-11-16 MED ORDER — CEFPODOXIME PROXETIL 200 MG PO TABS
200.0000 mg | ORAL_TABLET | Freq: Once | ORAL | Status: AC
  Administered 2014-11-16: 200 mg via ORAL
  Filled 2014-11-16: qty 1

## 2014-11-16 MED ORDER — CEFPODOXIME PROXETIL 200 MG PO TABS: 200.0000 mg | ORAL_TABLET | Freq: Two times a day (BID) | ORAL | Status: DC

## 2014-11-16 MED ORDER — HYDROCODONE-ACETAMINOPHEN 5-325 MG PO TABS
2.0000 | ORAL_TABLET | Freq: Once | ORAL | Status: AC
  Administered 2014-11-16: 2 via ORAL
  Filled 2014-11-16: qty 2

## 2014-11-16 NOTE — ED Notes (Addendum)
Pt arrived via EMS from [facility] with report of hematuria and blood clots noted in suprapubic. Pt reported hematuria and having bladder spasms. He was suppose to f/u with urologist x1 week ago and did not. Pt denies lower abd/back pain. Urine clear without visible sediment or foul odor noted. Pt has dx of urinary retention.

## 2014-11-16 NOTE — Progress Notes (Signed)
CSW met with patient at bedside. There was no family present. Patient presents to [facility] due to Hematuria. Patient confirms that he is from [facility]. He states that he has been living at the facility for the past 2 years.  Patient states that he can usually complete his ADL's independently. Patient informed CSW that he fell often in the month of August.  According to patient, he often uses a wheelchair to get around. Patient states that his primary support is his son.  CSW consulted with nurse who states that she has called [facility] for transport.  Willette Brace [phone] ED CSW 11/16/2014 4:05 PM

## 2014-11-16 NOTE — ED Notes (Addendum)
Irrigate suprapubic cath using sterile water pt tolerated well. No resistance, hematuria, or clots noted. Pt reported having bladder spasms during procedure.

## 2014-11-16 NOTE — ED Provider Notes (Signed)
CSN: [id number]     Arrival date & time 11/16/14  1301 History   First MD Initiated Contact with Patient 11/16/14 1308     Chief Complaint  Patient presents with  . Hematuria     (Consider location/radiation/quality/duration/timing/severity/associated sxs/prior Treatment) Patient is a 64 y.o. male presenting with hematuria. The history is provided by the patient.  Hematuria This is a new problem. The current episode started 3 to 5 hours ago. The problem occurs constantly. The problem has not changed since onset.Associated symptoms include abdominal pain. Pertinent negatives include no shortness of breath. Nothing aggravates the symptoms. Nothing relieves the symptoms. He has tried nothing for the symptoms.    Past Medical History  Diagnosis Date  . Aneurysm   . Clotting disorder   . Depressed   . Vitamin D deficiency disease   . Constipation   . GERD (gastroesophageal reflux disease)   . Neuropathy   . Urinary retention   . Anxiety    Past Surgical History  Procedure Laterality Date  . Cholecystectomy    . Splenectomy     Family History  Problem Relation Age of Onset  . Lung cancer Mother   . Other Father     lung problems/not sure of cause of death   Social History  Substance Use Topics  . Smoking status: Never Smoker   . Smokeless tobacco: Never Used  . Alcohol Use: No    Review of Systems  Constitutional: Negative for fever.  Respiratory: Negative for cough and shortness of breath.   Gastrointestinal: Positive for abdominal pain. Negative for vomiting.  Genitourinary: Positive for hematuria.  All other systems reviewed and are negative.     Allergies  Penicillins and Sulfa antibiotics  Home Medications   Prior to Admission medications   Medication Sig Start Date End Date Taking? Authorizing Provider  acetaminophen (TYLENOL) 325 MG tablet Take 650 mg by mouth every 4 (four) hours as needed for mild pain.    Yes Historical Provider, MD  baclofen  (LIORESAL) 10 MG tablet Take 5 mg by mouth daily.   Yes Historical Provider, MD  bisacodyl (DULCOLAX) 10 MG suppository Place 10 mg rectally daily as needed for moderate constipation.   Yes Historical Provider, MD  bisacodyl (DULCOLAX) 5 MG EC tablet Take 5 mg by mouth daily.    Yes Historical Provider, MD  Cranberry 250 MG CAPS Take 1 capsule by mouth 2 (two) times daily.   Yes Historical Provider, MD  diazepam (VALIUM) 5 MG tablet Take 5 mg by mouth 2 (two) times daily.   Yes Historical Provider, MD  DULoxetine (CYMBALTA) 30 MG capsule Take 30 mg by mouth 2 (two) times daily.    Yes Historical Provider, MD  gabapentin (NEURONTIN) 600 MG tablet Take 600 mg by mouth 3 (three) times daily.   Yes Historical Provider, MD  loperamide (IMODIUM A-D) 2 MG tablet Take 2 mg by mouth as needed for diarrhea or loose stools.   Yes Historical Provider, MD  LORazepam (ATIVAN) 0.5 MG tablet Take 0.5 mg by mouth daily as needed for anxiety (anxiety).    Yes Historical Provider, MD  mirabegron ER (MYRBETRIQ) 50 MG TB24 tablet Take 50 mg by mouth daily.   Yes Historical Provider, MD  mupirocin ointment (BACTROBAN) 2 % Place 1 application into the nose 3 (three) times daily as needed (to supra pubic area to prevent infection).    Yes Historical Provider, MD  ondansetron (ZOFRAN-ODT) 4 MG disintegrating tablet Take 4 mg by  mouth every 6 (six) hours as needed for nausea or vomiting.   Yes Historical Provider, MD  POLYETHYLENE GLYCOL 3350 PO Take 17 g/mL by mouth daily.    Yes Historical Provider, MD  QUEtiapine (SEROQUEL) 50 MG tablet Take 50 mg by mouth 2 (two) times daily. 50 mg in the am and 100 mg at bedtime.   Yes Historical Provider, MD  ranitidine (ZANTAC) 150 MG tablet Take 150 mg by mouth 2 (two) times daily.   Yes Historical Provider, MD  sertraline (ZOLOFT) 100 MG tablet Take 100 mg by mouth daily.   Yes Historical Provider, MD  tamsulosin (FLOMAX) 0.4 MG CAPS capsule Take 0.4 mg by mouth at bedtime.    Yes  Historical Provider, MD  traMADol (ULTRAM) 50 MG tablet Take 50 mg by mouth every 6 (six) hours as needed for moderate pain (pain).    Yes Historical Provider, MD  Vitamin D, Ergocalciferol, (DRISDOL) 50000 UNITS CAPS capsule Take 50,000 Units by mouth every 7 (seven) days. Saturday   Yes Historical Provider, MD  warfarin (COUMADIN) 6 MG tablet Take 6 mg by mouth daily.   Yes Historical Provider, MD  cefpodoxime (VANTIN) 200 MG tablet Take 1 tablet (200 mg total) by mouth 2 (two) times daily. 11/16/14   Elwin Mocha, MD  oxyCODONE-acetaminophen (PERCOCET/ROXICET) 5-325 MG per tablet Take 1-2 tablets by mouth every 6 (six) hours as needed for severe pain. Patient not taking: Reported on 11/16/2014 06/24/14   Renne Crigler, PA-C   BP 142/85 mmHg  Pulse 69  Temp(Src) 97.8 F (36.6 C) (Oral)  Resp 18  Ht  (1.778 m)  Wt 221 lb (100.245 kg)  BMI 31.71 kg/m2  SpO2 100% Physical Exam  Constitutional: He is oriented to person, place, and time. He appears well-developed and well-nourished. No distress.  HENT:  Head: Normocephalic and atraumatic.  Mouth/Throat: No oropharyngeal exudate.  Eyes: EOM are normal. Pupils are equal, round, and reactive to light.  Neck: Normal range of motion. Neck supple.  Cardiovascular: Normal rate and regular rhythm.  Exam reveals no friction rub.   No murmur heard. Pulmonary/Chest: Effort normal and breath sounds normal. No respiratory distress. He has no wheezes. He has no rales.  Abdominal: Soft. He exhibits no distension. There is tenderness (mild, suprapubic). There is no rebound.  Musculoskeletal: Normal range of motion. He exhibits no edema.  Neurological: He is alert and oriented to person, place, and time.  Skin: No rash noted. He is not diaphoretic.  Nursing note and vitals reviewed.   ED Course  Procedures (including critical care time) Labs Review Labs Reviewed  URINALYSIS, ROUTINE W REFLEX MICROSCOPIC (NOT AT [facility]) - Abnormal; Notable for the  following:    Specific Gravity, Urine 1.003 (*)    Hgb urine dipstick LARGE (*)    Nitrite POSITIVE (*)    Leukocytes, UA SMALL (*)    All other components within normal limits  URINE MICROSCOPIC-ADD ON - Abnormal; Notable for the following:    Bacteria, UA FEW (*)    All other components within normal limits  URINE CULTURE    Imaging Review No results found. I have personally reviewed and evaluated these images and lab results as part of my medical decision-making.   EKG Interpretation None      MDM   Final diagnoses:  Hematuria  Acute cystitis with hematuria    64 year old male here with hematuria and some suprapubic pain. History of a suprapubic catheter due to incomplete emptying. He had  a catheter for about 6-7 years. He had some blood clots in his catheter earlier today. No fever, vomiting. Here urine is dark but no blood in the tube. We flushed the Foley without any hematuria after that. Urine does appear infected. Will put on Vantin. He stable relaxing comfortably. Vitals are normal. Stable for discharge.    Elwin Mocha, MD 11/16/14 [phone]

## 2014-11-16 NOTE — ED Notes (Signed)
Communications called for [facility] transport back to [facility].

## 2014-11-16 NOTE — ED Notes (Signed)
Bed: ZO10 Expected date:  Expected time:  Means of arrival:  Comments: Ems- blood clots in urine

## 2014-11-16 NOTE — ED Notes (Signed)
Awake. Verbally responsive. A/O x4. Resp even and unlabored. No audible adventitious breath sounds noted. ABC's intact.  

## 2014-11-16 NOTE — Discharge Instructions (Signed)

## 2014-11-19 LAB — URINE CULTURE: Special Requests: NORMAL

## 2014-11-20 ENCOUNTER — Telehealth (HOSPITAL_BASED_OUTPATIENT_CLINIC_OR_DEPARTMENT_OTHER): Payer: Self-pay | Admitting: Emergency Medicine

## 2014-11-20 NOTE — Telephone Encounter (Signed)
Symptom check per order of Babs Bertin PharmD and Danelle Berry PA, attempted to call [facility] and pt @ listed number for symptom check Urine + Enterococcus and proteus treated with Cefpodoxime  tabs bid x 10 days, if symptoms pt will need to return to ED for admission for IV antibiotics

## 2014-11-20 NOTE — Progress Notes (Addendum)
ED Antimicrobial Stewardship Positive Culture Follow Up   SILUS LANZO is an 64 y.o. male who presented to [facility] on 11/16/2014 with a chief complaint of  Chief Complaint  Patient presents with  . Hematuria    Recent Results (from the past 720 hour(s))  Urine culture     Status: None   Collection Time: 11/16/14  1:54 PM  Result Value Ref Range Status   Specimen Description URINE, CATHETERIZED  Final   Special Requests Normal  Final   Culture   Final    >=100,000 COLONIES/mL PROTEUS MIRABILIS >=100,000 COLONIES/mL ENTEROCOCCUS SPECIES Performed at [facility]    Report Status 11/19/2014 FINAL  Final   Organism ID, Bacteria PROTEUS MIRABILIS  Final   Organism ID, Bacteria ENTEROCOCCUS SPECIES  Final      Susceptibility   Proteus mirabilis - MIC*    AMPICILLIN >=32 RESISTANT Resistant     CEFAZOLIN >=64 RESISTANT Resistant     CEFTRIAXONE >=64 RESISTANT Resistant     CIPROFLOXACIN >=4 RESISTANT Resistant     GENTAMICIN >=16 RESISTANT Resistant     IMIPENEM 2 SENSITIVE Sensitive     NITROFURANTOIN 128 RESISTANT Resistant     TRIMETH/SULFA >=320 RESISTANT Resistant     AMPICILLIN/SULBACTAM >=32 RESISTANT Resistant     PIP/TAZO <=4 SENSITIVE Sensitive     * >=100,000 COLONIES/mL PROTEUS MIRABILIS   Enterococcus species - MIC*    AMPICILLIN <=2 SENSITIVE Sensitive     LEVOFLOXACIN >=8 RESISTANT Resistant     NITROFURANTOIN <=16 SENSITIVE Sensitive     VANCOMYCIN 1 SENSITIVE Sensitive     * >=100,000 COLONIES/mL ENTEROCOCCUS SPECIES     Treated with cefpoxdoxime, organism resistant to prescribed antimicrobial  63 yom with CC hematuria and suprapubic pain x 3-5 days. PMH: aneurysm, clotting disorder, urinary retention. Clots in suprapubic catheter (chronic). Flushed foley and discharged on cefpodoxime. Afebrile, vitals wnl. UC grew proteus (Sens only to Zosyn, imipenem) and enterococcus (S-amp, amp), both >100k colonies. Pt is from [facility].  New antibiotic  prescription: Per discussion with PA, call patient. If symptomatic, have pt return for IV abx (Zosyn). If not symptomatic, have pt f/u with PCP.  ED Provider: Lavada Mesi, PA-C   Babs Bertin P 11/20/2014, 11:41 AM Clinical Pharmacist Phone# [phone]

## 2014-11-22 ENCOUNTER — Telehealth (HOSPITAL_COMMUNITY): Payer: Self-pay

## 2014-11-23 ENCOUNTER — Telehealth (HOSPITAL_BASED_OUTPATIENT_CLINIC_OR_DEPARTMENT_OTHER): Payer: Self-pay | Admitting: Emergency Medicine

## 2014-11-25 ENCOUNTER — Telehealth (HOSPITAL_COMMUNITY): Payer: Self-pay

## 2014-11-25 NOTE — Telephone Encounter (Signed)
Call rcvd from pt after receiving letter.  Pt informed of dx .  Pt stated he had 1 more episode of bleeding in catheter but hasn't had any other symptoms since.  Pt instructed to follow up w/PCP.

## 2014-12-08 ENCOUNTER — Telehealth: Payer: Self-pay | Admitting: Diagnostic Neuroimaging

## 2014-12-08 NOTE — Telephone Encounter (Signed)
Dr Marjory LiesPenumalli sent referral [facility]'s OV here July 2015.  Left VM on patient's phone re: Dr Marjory LiesPenumalli had referred him to Palliative care last July and also advised he return to PCP for pain management. Left this caller's name, number.

## 2014-12-08 NOTE — Telephone Encounter (Signed)
Phone call from [facility] [phone] called regarding patient having nerve pain in legs and waist, has had for quite a while now but patient states it's moving further up his back. Patient does not see anyone for pain mgmt.

## 2014-12-08 NOTE — Telephone Encounter (Signed)
[facility], will call again.

## 2014-12-08 NOTE — Telephone Encounter (Signed)
Pt called to schedule appt. Per Daniel PancoastMary Wolfe  Dr Marjory LiesPenumalli will not handle pain management. Last OV note states PCP will have to handle pain management and palliative care. Patient understood.

## 2015-02-19 ENCOUNTER — Emergency Department (HOSPITAL_COMMUNITY)
Admission: EM | Admit: 2015-02-19 | Discharge: 2015-02-19 | Disposition: A | Discharge status: Home / Self Care | Payer: Commercial Managed Care - HMO | Attending: Emergency Medicine | Admitting: Emergency Medicine

## 2015-02-19 ENCOUNTER — Emergency Department (HOSPITAL_COMMUNITY): Payer: Commercial Managed Care - HMO

## 2015-02-19 ENCOUNTER — Encounter (HOSPITAL_COMMUNITY): Payer: Self-pay

## 2015-02-19 DIAGNOSIS — E559 Vitamin D deficiency, unspecified: Secondary | ICD-10-CM | POA: Insufficient documentation

## 2015-02-19 DIAGNOSIS — F419 Anxiety disorder, unspecified: Secondary | ICD-10-CM | POA: Insufficient documentation

## 2015-02-19 DIAGNOSIS — R9389 Abnormal findings on diagnostic imaging of other specified body structures: Secondary | ICD-10-CM

## 2015-02-19 DIAGNOSIS — J029 Acute pharyngitis, unspecified: Secondary | ICD-10-CM | POA: Insufficient documentation

## 2015-02-19 DIAGNOSIS — R918 Other nonspecific abnormal finding of lung field: Secondary | ICD-10-CM | POA: Insufficient documentation

## 2015-02-19 DIAGNOSIS — Z862 Personal history of diseases of the blood and blood-forming organs and certain disorders involving the immune mechanism: Secondary | ICD-10-CM | POA: Diagnosis not present

## 2015-02-19 DIAGNOSIS — Z8679 Personal history of other diseases of the circulatory system: Secondary | ICD-10-CM | POA: Diagnosis not present

## 2015-02-19 DIAGNOSIS — G629 Polyneuropathy, unspecified: Secondary | ICD-10-CM | POA: Diagnosis not present

## 2015-02-19 DIAGNOSIS — Z792 Long term (current) use of antibiotics: Secondary | ICD-10-CM | POA: Insufficient documentation

## 2015-02-19 DIAGNOSIS — K219 Gastro-esophageal reflux disease without esophagitis: Secondary | ICD-10-CM | POA: Diagnosis not present

## 2015-02-19 DIAGNOSIS — Z7901 Long term (current) use of anticoagulants: Secondary | ICD-10-CM | POA: Insufficient documentation

## 2015-02-19 DIAGNOSIS — Z880 Allergy status to penicillin: Secondary | ICD-10-CM | POA: Insufficient documentation

## 2015-02-19 DIAGNOSIS — R600 Localized edema: Secondary | ICD-10-CM | POA: Diagnosis not present

## 2015-02-19 DIAGNOSIS — Z79899 Other long term (current) drug therapy: Secondary | ICD-10-CM | POA: Insufficient documentation

## 2015-02-19 DIAGNOSIS — F329 Major depressive disorder, single episode, unspecified: Secondary | ICD-10-CM | POA: Diagnosis not present

## 2015-02-19 MED ORDER — CEFTRIAXONE SODIUM 1 G IJ SOLR
1.0000 g | Freq: Once | INTRAMUSCULAR | Status: AC
  Administered 2015-02-19: 1 g via INTRAMUSCULAR
  Filled 2015-02-19: qty 10

## 2015-02-19 MED ORDER — LIDOCAINE HCL 1 % IJ SOLN
INTRAMUSCULAR | Status: AC
  Administered 2015-02-19: 20 mL
  Filled 2015-02-19: qty 20

## 2015-02-19 MED ORDER — AZITHROMYCIN 250 MG PO TABS
500.0000 mg | ORAL_TABLET | Freq: Once | ORAL | Status: AC
  Administered 2015-02-19: 500 mg via ORAL
  Filled 2015-02-19: qty 2

## 2015-02-19 MED ORDER — HYDROCODONE-ACETAMINOPHEN 7.5-325 MG/15ML PO SOLN
10.0000 mL | Freq: Once | ORAL | Status: AC
  Administered 2015-02-19: 10 mL via ORAL
  Filled 2015-02-19: qty 15

## 2015-02-19 MED ORDER — AZITHROMYCIN 250 MG PO TABS: 250.0000 mg | ORAL_TABLET | Freq: Every day | ORAL | Status: DC

## 2015-02-19 NOTE — Discharge Instructions (Signed)
Your xray shows a possible pneumonia - your throat looks like you have strep throat, we have given you a shot of strong medicine and the Z pak for home,  Please obtain all of your results from medical records or have your doctors office obtain the results - share them with your doctor - you should be seen at your doctors office in the next 2 days. Call today to arrange your follow up. Take the medications as prescribed. Please review all of the medicines and only take them if you do not have an allergy to them. Please be aware that if you are taking birth control pills, taking other prescriptions, ESPECIALLY ANTIBIOTICS may make the birth control ineffective - if this is the case, either do not engage in sexual activity or use alternative methods of birth control such as condoms until you have finished the medicine and your family doctor says it is OK to restart them. If you are on a blood thinner such as COUMADIN, be aware that any other medicine that you take may cause the coumadin to either work too much, or not enough - you should have your coumadin level rechecked in next 7 days if this is the case.  ?  It is also a possibility that you have an allergic reaction to any of the medicines that you have been prescribed - Everybody reacts differently to medications and while MOST people have no trouble with most medicines, you may have a reaction such as nausea, vomiting, rash, swelling, shortness of breath. If this is the case, please stop taking the medicine immediately and contact your physician.  ?  You should return to the ER if you develop severe or worsening symptoms.

## 2015-02-19 NOTE — ED Notes (Signed)
[facility] called for transport.  

## 2015-02-19 NOTE — ED Provider Notes (Signed)
CSN: [id number]     Arrival date & time 02/19/15  1729 History   First MD Initiated Contact with Patient 02/19/15 1746     Chief Complaint  Patient presents with  . Sore Throat     (Consider location/radiation/quality/duration/timing/severity/associated sxs/prior Treatment) HPI Comments: The patient is a 65 year old male, he has multiple medical problems, lives in an assisted care facility, states that he woke up at 1:30 AM with sore throat, he now states that he has pain with swallowing, he feels like he might have a fever but objectively he does not have a fever here. There is no cough, no swelling, no congested nose or runny nose. Symptoms are persistent, no medications given prior to arrival.  Patient is a 65 y.o. male presenting with pharyngitis. The history is provided by the patient.  Sore Throat    Past Medical History  Diagnosis Date  . Aneurysm (HCC)   . Clotting disorder (HCC)   . Depressed   . Vitamin D deficiency disease   . Constipation   . GERD (gastroesophageal reflux disease)   . Neuropathy (HCC)   . Urinary retention   . Anxiety    Past Surgical History  Procedure Laterality Date  . Cholecystectomy    . Splenectomy     Family History  Problem Relation Age of Onset  . Lung cancer Mother   . Other Father     lung problems/not sure of cause of death   Social History  Substance Use Topics  . Smoking status: Never Smoker   . Smokeless tobacco: Never Used  . Alcohol Use: No    Review of Systems  All other systems reviewed and are negative.     Allergies  Penicillins and Sulfa antibiotics  Home Medications   Prior to Admission medications   Medication Sig Start Date End Date Taking? Authorizing Provider  acetaminophen (TYLENOL) 325 MG tablet Take 650 mg by mouth every 4 (four) hours as needed for mild pain.     Historical Provider, MD  azithromycin (ZITHROMAX Z-PAK) 250 MG tablet Take 1 tablet (250 mg total) by mouth daily. 500mg  PO day 1, then  250mg  PO days 205 02/19/15   Eber HongBrian Asaad Gulley, MD  baclofen (LIORESAL) 10 MG tablet Take 5 mg by mouth daily.    Historical Provider, MD  bisacodyl (DULCOLAX) 10 MG suppository Place 10 mg rectally daily as needed for moderate constipation.    Historical Provider, MD  bisacodyl (DULCOLAX) 5 MG EC tablet Take 5 mg by mouth daily.     Historical Provider, MD  cefpodoxime (VANTIN) 200 MG tablet Take 1 tablet (200 mg total) by mouth 2 (two) times daily. 11/16/14   Elwin MochaBlair Walden, MD  Cranberry 250 MG CAPS Take 1 capsule by mouth 2 (two) times daily.    Historical Provider, MD  diazepam (VALIUM) 5 MG tablet Take 5 mg by mouth 2 (two) times daily.    Historical Provider, MD  DULoxetine (CYMBALTA) 30 MG capsule Take 30 mg by mouth 2 (two) times daily.     Historical Provider, MD  gabapentin (NEURONTIN) 600 MG tablet Take 600 mg by mouth 3 (three) times daily.    Historical Provider, MD  loperamide (IMODIUM A-D) 2 MG tablet Take 2 mg by mouth as needed for diarrhea or loose stools.    Historical Provider, MD  LORazepam (ATIVAN) 0.5 MG tablet Take 0.5 mg by mouth daily as needed for anxiety (anxiety).     Historical Provider, MD  mirabegron ER (MYRBETRIQ) 50  MG TB24 tablet Take 50 mg by mouth daily.    Historical Provider, MD  mupirocin ointment (BACTROBAN) 2 % Place 1 application into the nose 3 (three) times daily as needed (to supra pubic area to prevent infection).     Historical Provider, MD  ondansetron (ZOFRAN-ODT) 4 MG disintegrating tablet Take 4 mg by mouth every 6 (six) hours as needed for nausea or vomiting.    Historical Provider, MD  oxyCODONE-acetaminophen (PERCOCET/ROXICET) 5-325 MG per tablet Take 1-2 tablets by mouth every 6 (six) hours as needed for severe pain. Patient not taking: Reported on 11/16/2014 06/24/14   Renne Crigler, PA-C  POLYETHYLENE GLYCOL 3350 PO Take 17 g/mL by mouth daily.     Historical Provider, MD  QUEtiapine (SEROQUEL) 50 MG tablet Take 50 mg by mouth 2 (two) times daily. 50 mg  in the am and 100 mg at bedtime.    Historical Provider, MD  ranitidine (ZANTAC) 150 MG tablet Take 150 mg by mouth 2 (two) times daily.    Historical Provider, MD  sertraline (ZOLOFT) 100 MG tablet Take 100 mg by mouth daily.    Historical Provider, MD  tamsulosin (FLOMAX) 0.4 MG CAPS capsule Take 0.4 mg by mouth at bedtime.     Historical Provider, MD  traMADol (ULTRAM) 50 MG tablet Take 50 mg by mouth every 6 (six) hours as needed for moderate pain (pain).     Historical Provider, MD  Vitamin D, Ergocalciferol, (DRISDOL) 50000 UNITS CAPS capsule Take 50,000 Units by mouth every 7 (seven) days. Saturday    Historical Provider, MD  warfarin (COUMADIN) 6 MG tablet Take 6 mg by mouth daily.    Historical Provider, MD   BP 148/93 mmHg  Pulse 96  Temp(Src) 98.1 F (36.7 C) (Oral)  Resp 20  SpO2 93% Physical Exam  Constitutional: He appears well-developed and well-nourished. No distress.  HENT:  Head: Normocephalic and atraumatic.  Mouth/Throat: Oropharynx is clear and moist. No oropharyngeal exudate.  Pharynx is erythematous, it appears to be swollen bilaterally but there is no signs of uvular deviation, no signs of swelling around the tonsillar pillars to suggest abscess, phonation is normal, mucous members are moist, no exudate  Eyes: Conjunctivae and EOM are normal. Pupils are equal, round, and reactive to light. Right eye exhibits no discharge. Left eye exhibits no discharge. No scleral icterus.  Neck: Normal range of motion. Neck supple. No JVD present. No thyromegaly present.  Cardiovascular: Normal rate, regular rhythm, normal heart sounds and intact distal pulses.  Exam reveals no gallop and no friction rub.   No murmur heard. Pulmonary/Chest: Effort normal and breath sounds normal. No respiratory distress. He has no wheezes. He has no rales.  Abdominal: Soft. Bowel sounds are normal. He exhibits no distension and no mass. There is no tenderness.  Soft nontender abdomen, suprapubic  catheter in place  Musculoskeletal: Normal range of motion. He exhibits edema (bilateral symmetrical mild pitting edema of the legs). He exhibits no tenderness.  Lymphadenopathy:    He has no cervical adenopathy.  Neurological: He is alert. Coordination normal.  The patient is able to move all 4 extremities and does have some generalized weakness in his legs, normal strength in his arms, cranial nerves III through XII are normal  Skin: Skin is warm and dry. No rash noted. No erythema.  Psychiatric: He has a normal mood and affect. His behavior is normal.  Nursing note and vitals reviewed.   ED Course  Procedures (including critical care time)  Labs Review Labs Reviewed - No data to display  Imaging Review Dg Chest 2 View  02/19/2015  CLINICAL DATA:  Chest soreness. Cough, shortness of breath and sore throat for 1 day. EXAM: CHEST  2 VIEW COMPARISON:  None. FINDINGS: Lung volumes are low. There are patchy bibasilar opacities, left greater than right. Possible small left pleural effusion. The heart size is upper limits of normal for technique. Vascular congestion without evidence of pulmonary edema, allowing for low lung volumes. No pneumothorax. Postsurgical change in the upper abdomen with multiple surgical clips. IMPRESSION: Hypoventilatory chest. Patchy bibasilar opacities could reflect atelectasis, pneumonia, or aspiration in the appropriate clinical setting. Possible small left pleural effusion. Electronically Signed   By: Rubye Oaks M.D.   On: 02/19/2015 18:29   I have personally reviewed and evaluated these images and lab results as part of my medical decision-making.    MDM   Final diagnoses:  Sore throat  Abnormal chest xray    The patient has vital signs which are unremarkable, we'll obtain a chest x-ray as the patient does have an oxygen of 93%. He does have some upper chest pain as he feels like this pain is moving from his throat down into his neck, doubt cardiac  disease, doubt pulmonary disease but we'll obtain chest x-ray. Treat for strep throat presumptively.  X-ray reviewed, possible patchy infiltrates, possibly atelectasis, no fever or tachycardia or hypotension. Has been given medicines to cover community-acquired pneumonia as well as pharyngitis. The patient appears stable for discharge, he is in no respiratory distress and speaks in full sentences.  Meds given in ED:  Medications  cefTRIAXone (ROCEPHIN) injection 1 g (not administered)  azithromycin (ZITHROMAX) tablet 500 mg (not administered)  HYDROcodone-acetaminophen (HYCET) 7.5-325 mg/15 ml solution 10 mL (not administered)    New Prescriptions   AZITHROMYCIN (ZITHROMAX Z-PAK) 250 MG TABLET    Take 1 tablet (250 mg total) by mouth daily. 500mg  PO day 1, then 250mg  PO days 205      Eber Hong, MD 02/19/15 [phone]
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
## 2015-02-20 ENCOUNTER — Emergency Department (HOSPITAL_COMMUNITY): Payer: Commercial Managed Care - HMO

## 2015-02-20 ENCOUNTER — Inpatient Hospital Stay (HOSPITAL_COMMUNITY)
Admission: EM | Admit: 2015-02-20 | Discharge: 2015-02-24 | DRG: 152 | Disposition: A | Discharge status: Nursing Home (Includes ICF) | Payer: Commercial Managed Care - HMO | Attending: Family Medicine | Admitting: Family Medicine

## 2015-02-20 ENCOUNTER — Encounter (HOSPITAL_COMMUNITY): Payer: Self-pay | Admitting: Family Medicine

## 2015-02-20 DIAGNOSIS — I671 Cerebral aneurysm, nonruptured: Secondary | ICD-10-CM | POA: Diagnosis present

## 2015-02-20 DIAGNOSIS — G8220 Paraplegia, unspecified: Secondary | ICD-10-CM | POA: Diagnosis present

## 2015-02-20 DIAGNOSIS — Z888 Allergy status to other drugs, medicaments and biological substances status: Secondary | ICD-10-CM

## 2015-02-20 DIAGNOSIS — R112 Nausea with vomiting, unspecified: Secondary | ICD-10-CM | POA: Diagnosis not present

## 2015-02-20 DIAGNOSIS — F329 Major depressive disorder, single episode, unspecified: Secondary | ICD-10-CM | POA: Diagnosis present

## 2015-02-20 DIAGNOSIS — K219 Gastro-esophageal reflux disease without esophagitis: Secondary | ICD-10-CM | POA: Diagnosis present

## 2015-02-20 DIAGNOSIS — Z79899 Other long term (current) drug therapy: Secondary | ICD-10-CM

## 2015-02-20 DIAGNOSIS — R221 Localized swelling, mass and lump, neck: Secondary | ICD-10-CM

## 2015-02-20 DIAGNOSIS — R0902 Hypoxemia: Secondary | ICD-10-CM | POA: Diagnosis present

## 2015-02-20 DIAGNOSIS — N319 Neuromuscular dysfunction of bladder, unspecified: Secondary | ICD-10-CM | POA: Diagnosis present

## 2015-02-20 DIAGNOSIS — G629 Polyneuropathy, unspecified: Secondary | ICD-10-CM | POA: Diagnosis present

## 2015-02-20 DIAGNOSIS — J020 Streptococcal pharyngitis: Principal | ICD-10-CM | POA: Diagnosis present

## 2015-02-20 DIAGNOSIS — Z880 Allergy status to penicillin: Secondary | ICD-10-CM

## 2015-02-20 DIAGNOSIS — Z7901 Long term (current) use of anticoagulants: Secondary | ICD-10-CM

## 2015-02-20 DIAGNOSIS — D6859 Other primary thrombophilia: Secondary | ICD-10-CM | POA: Diagnosis present

## 2015-02-20 DIAGNOSIS — Z883 Allergy status to other anti-infective agents status: Secondary | ICD-10-CM

## 2015-02-20 DIAGNOSIS — J810 Acute pulmonary edema: Secondary | ICD-10-CM | POA: Diagnosis present

## 2015-02-20 DIAGNOSIS — R338 Other retention of urine: Secondary | ICD-10-CM | POA: Diagnosis present

## 2015-02-20 DIAGNOSIS — J0510 Acute epiglottitis without obstruction: Secondary | ICD-10-CM | POA: Diagnosis present

## 2015-02-20 DIAGNOSIS — F419 Anxiety disorder, unspecified: Secondary | ICD-10-CM | POA: Diagnosis present

## 2015-02-20 DIAGNOSIS — J029 Acute pharyngitis, unspecified: Secondary | ICD-10-CM | POA: Diagnosis present

## 2015-02-20 DIAGNOSIS — Y95 Nosocomial condition: Secondary | ICD-10-CM | POA: Diagnosis present

## 2015-02-20 DIAGNOSIS — J189 Pneumonia, unspecified organism: Secondary | ICD-10-CM | POA: Diagnosis not present

## 2015-02-20 DIAGNOSIS — J690 Pneumonitis due to inhalation of food and vomit: Secondary | ICD-10-CM

## 2015-02-20 DIAGNOSIS — N401 Enlarged prostate with lower urinary tract symptoms: Secondary | ICD-10-CM | POA: Diagnosis present

## 2015-02-20 DIAGNOSIS — Z66 Do not resuscitate: Secondary | ICD-10-CM | POA: Diagnosis present

## 2015-02-20 DIAGNOSIS — Z993 Dependence on wheelchair: Secondary | ICD-10-CM

## 2015-02-20 HISTORY — DX: Paraplegia, unspecified: G82.20

## 2015-02-20 HISTORY — DX: Neuromuscular dysfunction of bladder, unspecified: N31.9

## 2015-02-20 HISTORY — DX: Cerebral aneurysm, nonruptured: I67.1

## 2015-02-20 HISTORY — DX: Benign prostatic hyperplasia without lower urinary tract symptoms: N40.0

## 2015-02-20 LAB — CBC WITH DIFFERENTIAL/PLATELET
BASOS ABS: 0.1 10*3/uL (ref 0.0–0.1)
Basophils Relative: 0 %
EOS PCT: 0 %
Eosinophils Absolute: 0.1 10*3/uL (ref 0.0–0.7)
HEMATOCRIT: 45 % (ref 39.0–52.0)
HEMOGLOBIN: 14.7 g/dL (ref 13.0–17.0)
LYMPHS ABS: 1.9 10*3/uL (ref 0.7–4.0)
Lymphocytes Relative: 10 %
MCH: 30.9 pg (ref 26.0–34.0)
MCHC: 32.7 g/dL (ref 30.0–36.0)
MCV: 94.7 fL (ref 78.0–100.0)
Monocytes Absolute: 2.9 10*3/uL — ABNORMAL HIGH (ref 0.1–1.0)
Monocytes Relative: 15 %
Neutro Abs: 14.3 10*3/uL — ABNORMAL HIGH (ref 1.7–7.7)
Neutrophils Relative %: 75 %
Platelets: 598 10*3/uL — ABNORMAL HIGH (ref 150–400)
RBC: 4.75 MIL/uL (ref 4.22–5.81)
RDW: 19.1 % — AB (ref 11.5–15.5)
WBC: 19.2 10*3/uL — AB (ref 4.0–10.5)

## 2015-02-20 LAB — BASIC METABOLIC PANEL
Anion gap: 9 (ref 5–15)
BUN: 13 mg/dL (ref 6–20)
CO2: 24 mmol/L (ref 22–32)
CREATININE: 1.18 mg/dL (ref 0.61–1.24)
Calcium: 9 mg/dL (ref 8.9–10.3)
Chloride: 105 mmol/L (ref 101–111)
GFR calc Af Amer: >60 mL/min (ref >60)
Glucose, Bld: 105 mg/dL — ABNORMAL HIGH (ref 65–99)
Potassium: 4.2 mmol/L (ref 3.5–5.1)
SODIUM: 138 mmol/L (ref 135–145)

## 2015-02-20 LAB — I-STAT CG4 LACTIC ACID, ED
LACTIC ACID, VENOUS: 1.33 mmol/L (ref 0.5–2.0)
Lactic Acid, Venous: 0.54 mmol/L (ref 0.5–2.0)

## 2015-02-20 LAB — I-STAT TROPONIN, ED: Troponin i, poc: 0.01 ng/mL (ref 0.00–0.08)

## 2015-02-20 LAB — PROTIME-INR
INR: 3.53 — ABNORMAL HIGH (ref 0.00–1.49)
Prothrombin Time: 34.6 seconds — ABNORMAL HIGH (ref 11.6–15.2)

## 2015-02-20 LAB — RAPID STREP SCREEN (MED CTR MEBANE ONLY): STREPTOCOCCUS, GROUP A SCREEN (DIRECT): NEGATIVE

## 2015-02-20 LAB — MRSA PCR SCREENING: MRSA by PCR: POSITIVE — AB

## 2015-02-20 MED ORDER — ONDANSETRON 4 MG PO TBDP
4.0000 mg | ORAL_TABLET | Freq: Four times a day (QID) | ORAL | Status: DC | PRN
Start: 2015-02-20 — End: 2015-02-20

## 2015-02-20 MED ORDER — ONDANSETRON HCL 4 MG/2ML IJ SOLN
4.0000 mg | Freq: Four times a day (QID) | INTRAMUSCULAR | Status: DC | PRN
  Administered 2015-02-20 – 2015-02-21 (×3): 4 mg via INTRAVENOUS
  Filled 2015-02-20 (×3): qty 2

## 2015-02-20 MED ORDER — ACETAMINOPHEN 325 MG PO TABS: 650.0000 mg | ORAL_TABLET | Freq: Four times a day (QID) | ORAL | Status: DC | PRN

## 2015-02-20 MED ORDER — FAMOTIDINE 10 MG PO TABS
20.0000 mg | ORAL_TABLET | Freq: Every day | ORAL | Status: DC
  Administered 2015-02-21 – 2015-02-24 (×4): 20 mg via ORAL
  Filled 2015-02-20 (×5): qty 2

## 2015-02-20 MED ORDER — SODIUM CHLORIDE 0.9 % IV SOLN
INTRAVENOUS | Status: DC
  Administered 2015-02-20 – 2015-02-23 (×4): via INTRAVENOUS

## 2015-02-20 MED ORDER — GABAPENTIN 600 MG PO TABS
600.0000 mg | ORAL_TABLET | Freq: Three times a day (TID) | ORAL | Status: DC
  Administered 2015-02-21 – 2015-02-24 (×11): 600 mg via ORAL
  Filled 2015-02-20 (×11): qty 1

## 2015-02-20 MED ORDER — POLYETHYLENE GLYCOL 3350 17 G PO PACK
17.0000 g | PACK | Freq: Every day | ORAL | Status: DC
  Administered 2015-02-21 – 2015-02-23 (×3): 17 g via ORAL
  Filled 2015-02-20 (×5): qty 1

## 2015-02-20 MED ORDER — TRAMADOL HCL 50 MG PO TABS
50.0000 mg | ORAL_TABLET | Freq: Four times a day (QID) | ORAL | Status: DC | PRN
  Administered 2015-02-21 – 2015-02-24 (×3): 50 mg via ORAL
  Filled 2015-02-20 (×3): qty 1

## 2015-02-20 MED ORDER — ONDANSETRON HCL 4 MG/2ML IJ SOLN: 4.0000 mg | Freq: Once | INTRAMUSCULAR | Status: DC

## 2015-02-20 MED ORDER — BACLOFEN 10 MG PO TABS
5.0000 mg | ORAL_TABLET | Freq: Every day | ORAL | Status: DC
  Administered 2015-02-21 – 2015-02-24 (×4): 5 mg via ORAL
  Filled 2015-02-20 (×5): qty 1

## 2015-02-20 MED ORDER — CEFTRIAXONE SODIUM 1 G IJ SOLR
1.0000 g | INTRAMUSCULAR | Status: DC
  Administered 2015-02-20: 1 g via INTRAVENOUS
  Filled 2015-02-20 (×2): qty 10

## 2015-02-20 MED ORDER — POLYETHYLENE GLYCOL 3350 17 GM/SCOOP PO POWD: 1.0000 | Freq: Every day | ORAL | Status: DC

## 2015-02-20 MED ORDER — MAGIC MOUTHWASH
5.0000 mL | Freq: Once | ORAL | Status: AC
  Administered 2015-02-20: 5 mL via ORAL
  Filled 2015-02-20: qty 5

## 2015-02-20 MED ORDER — CLINDAMYCIN PHOSPHATE 600 MG/50ML IV SOLN
600.0000 mg | Freq: Once | INTRAVENOUS | Status: AC
  Administered 2015-02-20: 600 mg via INTRAVENOUS
  Filled 2015-02-20: qty 50

## 2015-02-20 MED ORDER — LIDOCAINE VISCOUS 2 % MT SOLN
15.0000 mL | Freq: Once | OROMUCOSAL | Status: AC
  Administered 2015-02-20: 15 mL via OROMUCOSAL

## 2015-02-20 MED ORDER — ACETAMINOPHEN 650 MG RE SUPP
650.0000 mg | Freq: Four times a day (QID) | RECTAL | Status: DC | PRN
Start: 2015-02-20 — End: 2015-02-24

## 2015-02-20 MED ORDER — DEXAMETHASONE SODIUM PHOSPHATE 10 MG/ML IJ SOLN
10.0000 mg | Freq: Once | INTRAMUSCULAR | Status: DC
  Filled 2015-02-20: qty 1

## 2015-02-20 MED ORDER — BISACODYL 5 MG PO TBEC
5.0000 mg | DELAYED_RELEASE_TABLET | Freq: Every day | ORAL | Status: DC
  Administered 2015-02-21 – 2015-02-24 (×4): 5 mg via ORAL
  Filled 2015-02-20 (×5): qty 1

## 2015-02-20 MED ORDER — SODIUM CHLORIDE 0.9 % IV BOLUS (SEPSIS)
2000.0000 mL | Freq: Once | INTRAVENOUS | Status: AC
  Administered 2015-02-20: 2000 mL via INTRAVENOUS

## 2015-02-20 MED ORDER — DEXAMETHASONE SODIUM PHOSPHATE 10 MG/ML IJ SOLN
10.0000 mg | Freq: Once | INTRAMUSCULAR | Status: AC
  Administered 2015-02-20: 10 mg via INTRAVENOUS

## 2015-02-20 MED ORDER — PROMETHAZINE HCL 25 MG PO TABS
12.5000 mg | ORAL_TABLET | Freq: Four times a day (QID) | ORAL | Status: DC | PRN
  Filled 2015-02-20: qty 1

## 2015-02-20 NOTE — ED Notes (Signed)
Lab delay - RN trying to start IV. 

## 2015-02-20 NOTE — ED Notes (Addendum)
Pt request to attempt BM in bedside commode; with attempt to BM on commode pt removes Bowman by self.

## 2015-02-20 NOTE — ED Notes (Signed)
Dr Rosen at bedside 

## 2015-02-20 NOTE — ED Notes (Signed)
Attempted an IV x2- Unsuccessful.

## 2015-02-20 NOTE — ED Notes (Signed)
Patient is from [facility], he is complaining a sore throat with vomiting once. [facility] yesterday.

## 2015-02-20 NOTE — Progress Notes (Signed)
ANTICOAGULATION CONSULT NOTE - Initial Consult  Pharmacy Consult for Coumadin Indication: Protein S deficiency  Allergies  Allergen Reactions  . Penicillins Other (See Comments)    Unknown on MAR  . Senna Nausea And Vomiting  . Sulfa Antibiotics Other (See Comments)    [facility]    Patient Measurements: Height: 5\' 10"  (177.8 cm) Weight: 224 lb 13.9 oz (102 kg) IBW/kg (Calculated) : 73 Heparin Dosing Weight:   Vital Signs: Temp: 98.7 F (37.1 C) (01/02 1700) Temp Source: Oral (01/02 1700) BP: 142/90 mmHg (01/02 1600) Pulse Rate: 100 (01/02 1345)  Labs:  Recent Labs  02/20/15 0636 02/20/15 0710 02/20/15 1700  HGB 14.7  --   --   HCT 45.0  --   --   PLT 598*  --   --   LABPROT  --   --  34.6*  INR  --   --  3.53*  CREATININE  --  1.18  --     Estimated Creatinine Clearance: 77.7 mL/min (by C-G formula based on Cr of 1.18).   Medical History: Past Medical History  Diagnosis Date  . Aneurysm (HCC)   . Clotting disorder (HCC)   . Depressed   . Vitamin D deficiency disease   . Constipation   . GERD (gastroesophageal reflux disease)   . Neuropathy (HCC)   . Urinary retention   . Anxiety   . Spinal paraplegia (HCC)   . Neurogenic bladder   . Benign prostate hyperplasia   . Brain aneurysm     Medications:  Scheduled:  . baclofen  5 mg Oral Daily  . bisacodyl  5 mg Oral Daily  . cefTRIAXone (ROCEPHIN)  IV  1 g Intravenous Q24H  . famotidine  10 mg Oral Daily  . gabapentin  600 mg Oral TID  . ondansetron (ZOFRAN) IV  4 mg Intravenous Once  . 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  Per his Nursing Home MAG, his last dose was on 12/31.    Hg 14.7, pltc 598 INR 3.53 Cr 1.18 & lytes wnl  Goal of Therapy:  INR 2-3 Monitor platelets by anticoagulation protocol: Yes   Plan:  No Coumadin today Daily INR  Marisue HumbleKendra Deforest Maiden,  PharmD Clinical Pharmacist [facility]

## 2015-02-20 NOTE — ED Notes (Signed)
Attempt to call pt son/Michael per pt request via both numbers listed on file; no answer; will reattempt call at later time; pt updated.

## 2015-02-20 NOTE — Care Management Note (Signed)
Case Management Note  Patient Details  Name: Daniel Wolfe MRN: [id number] Date of Birth: 1950-03-13  Subjective/Objective:        Adm w problems swallowing            Action/Plan: lives at home   Expected Discharge Date:                  Expected Discharge Plan:     In-House Referral:     Discharge planning Services     Post Acute Care Choice:    Choice offered to:     DME Arranged:    DME Agency:     HH Arranged:    HH Agency:     Status of Service:     Medicare Important Message Given:    Date Medicare IM Given:    Medicare IM give by:    Date Additional Medicare IM Given:    Additional Medicare Important Message give by:     If discussed at Long Length of Stay Meetings, dates discussed:    Additional Comments: ur review done, pcp dr todd [facility]  Torrin Frein T, RN 02/20/2015, 1:58 PM

## 2015-02-20 NOTE — ED Notes (Signed)
Pt transported to radiology.

## 2015-02-20 NOTE — ED Provider Notes (Signed)
CSN: [id number]     Arrival date & time 02/20/15  0422 History   First MD Initiated Contact with Patient 02/20/15 [phone]     Chief Complaint  Patient presents with  . Emesis  . Sore Throat     (Consider location/radiation/quality/duration/timing/severity/associated sxs/prior Treatment) Patient is a 65 y.o. male presenting with pharyngitis. The history is provided by the patient and medical records.  Sore Throat This is a new problem. The current episode started yesterday. The problem occurs constantly. The problem has been rapidly worsening. Pertinent negatives include no chest pain. Nothing aggravates the symptoms. Nothing relieves the symptoms. Treatments tried: rocephin IM. The treatment provided no relief.  Seen earlier in the evening and diagnosed with PNA off CXR but unable to tolerate meds at home and was deteriorating and returned to the ED.    Past Medical History  Diagnosis Date  . Aneurysm (HCC)   . Clotting disorder (HCC)   . Depressed   . Vitamin D deficiency disease   . Constipation   . GERD (gastroesophageal reflux disease)   . Neuropathy (HCC)   . Urinary retention   . Anxiety   . Spinal paraplegia (HCC)   . Neurogenic bladder   . Benign prostate hyperplasia   . Brain aneurysm    Past Surgical History  Procedure Laterality Date  . Cholecystectomy    . Splenectomy     Family History  Problem Relation Age of Onset  . Lung cancer Mother   . Other Father     lung problems/not sure of cause of death   Social History  Substance Use Topics  . Smoking status: Never Smoker   . Smokeless tobacco: Never Used  . Alcohol Use: No    Review of Systems  Constitutional: Positive for fever.  HENT: Positive for sore throat, trouble swallowing and voice change.   Cardiovascular: Negative for chest pain.  All other systems reviewed and are negative.     Allergies  Penicillins; Senna; and Sulfa antibiotics  Home Medications   Prior to Admission medications    Medication Sig Start Date End Date Taking? Authorizing Provider  acetaminophen (TYLENOL) 325 MG tablet Take 650 mg by mouth 2 (two) times daily.    Yes Historical Provider, MD  baclofen (LIORESAL) 10 MG tablet Take 5 mg by mouth daily.   Yes Historical Provider, MD  bisacodyl (DULCOLAX) 10 MG suppository Place 10 mg rectally daily as needed for moderate constipation.   Yes Historical Provider, MD  bisacodyl (DULCOLAX) 5 MG EC tablet Take 5 mg by mouth daily.    Yes Historical Provider, MD  Cranberry 250 MG CAPS Take 1 capsule by mouth 2 (two) times daily.   Yes Historical Provider, MD  diazepam (VALIUM) 5 MG tablet Take 5 mg by mouth 2 (two) times daily.   Yes Historical Provider, MD  DULoxetine (CYMBALTA) 30 MG capsule Take 30 mg by mouth 3 (three) times daily.    Yes Historical Provider, MD  gabapentin (NEURONTIN) 600 MG tablet Take 600 mg by mouth 3 (three) times daily.   Yes Historical Provider, MD  loperamide (IMODIUM A-D) 2 MG tablet Take 2 mg by mouth as needed for diarrhea or loose stools.   Yes Historical Provider, MD  LORazepam (ATIVAN) 0.5 MG tablet Take 0.5 mg by mouth daily as needed for anxiety (anxiety).    Yes Historical Provider, MD  LYRICA 75 MG capsule Take 75 mg by mouth 2 (two) times daily. 02/02/15  Yes Historical Provider, MD  mirabegron ER (MYRBETRIQ) 50 MG TB24 tablet Take 50 mg by mouth daily.   Yes Historical Provider, MD  mupirocin ointment (BACTROBAN) 2 % Place 1 application into the nose 3 (three) times daily as needed (to supra pubic area to prevent infection).    Yes Historical Provider, MD  ondansetron (ZOFRAN-ODT) 4 MG disintegrating tablet Take 4 mg by mouth every 6 (six) hours as needed for nausea or vomiting.   Yes Historical Provider, MD  POLYETHYLENE GLYCOL 3350 PO Take 17 g/mL by mouth daily.    Yes Historical Provider, MD  QUEtiapine (SEROQUEL) 50 MG tablet Take 50-100 mg by mouth 2 (two) times daily. 50 mg in the am and 100 mg at bedtime.   Yes Historical  Provider, MD  ranitidine (ZANTAC) 150 MG tablet Take 150 mg by mouth 2 (two) times daily.   Yes Historical Provider, MD  sertraline (ZOLOFT) 100 MG tablet Take 100 mg by mouth daily.   Yes Historical Provider, MD  tamsulosin (FLOMAX) 0.4 MG CAPS capsule Take 0.4 mg by mouth at bedtime.    Yes Historical Provider, MD  traMADol (ULTRAM) 50 MG tablet Take 50 mg by mouth every 6 (six) hours as needed for moderate pain (pain).    Yes Historical Provider, MD  warfarin (COUMADIN) 6 MG tablet Take 6 mg by mouth daily.   Yes Historical Provider, MD  azithromycin (ZITHROMAX Z-PAK) 250 MG tablet Take 1 tablet (250 mg total) by mouth daily. 500mg  PO day 1, then 250mg  PO days 205 Patient not taking: Reported on 02/20/2015 02/19/15   Eber HongBrian Miller, MD  oxyCODONE-acetaminophen (PERCOCET/ROXICET) 5-325 MG per tablet Take 1-2 tablets by mouth every 6 (six) hours as needed for severe pain. Patient not taking: Reported on 11/16/2014 06/24/14   Renne CriglerJoshua Geiple, PA-C   BP 139/86 mmHg  Pulse 105  Temp(Src) 97.4 F (36.3 C) (Oral)  Resp 17  SpO2 93% Physical Exam  Constitutional: He is oriented to person, place, and time. He appears well-developed and well-nourished.  HENT:  Head: Normocephalic and atraumatic.  Mouth/Throat: No oropharyngeal exudate.  Swelling of the uvula and tonsillar pillars with erythema and dry membranes  Eyes: Pupils are equal, round, and reactive to light.  Neck:  Plummy voice, pain with displacement of the voice box, saliva in emesis bag  Cardiovascular: Normal rate, regular rhythm and intact distal pulses.   Pulmonary/Chest: No stridor. No respiratory distress. He has decreased breath sounds. He has no wheezes.  Abdominal: Soft. Bowel sounds are normal. There is no tenderness. There is no rebound and no guarding.  Musculoskeletal: Normal range of motion.  Neurological: He is alert and oriented to person, place, and time.  Skin: Skin is warm and dry.  Psychiatric: He has a normal mood and  affect.    ED Course  Procedures (including critical care time) Labs Review Labs Reviewed  CBC WITH DIFFERENTIAL/PLATELET - Abnormal; Notable for the following:    WBC 19.2 (*)    RDW 19.1 (*)    Platelets 598 (*)    Neutro Abs 14.3 (*)    Monocytes Absolute 2.9 (*)    All other components within normal limits  CULTURE, BLOOD (ROUTINE X 2)  CULTURE, BLOOD (ROUTINE X 2)  BASIC METABOLIC PANEL  I-STAT CHEM 8, ED  I-STAT TROPOININ, ED  I-STAT CG4 LACTIC ACID, ED    Imaging Review Dg Neck Soft Tissue  02/20/2015  CLINICAL DATA:  Acute onset of sore throat and pain with swallowing. Initial encounter. EXAM: NECK SOFT  TISSUES - 1+ VIEW COMPARISON:  None. FINDINGS: The nasopharynx, oropharynx and hypopharynx are unremarkable. The epiglottis appears thickened, measuring 1.7 cm in thickness. Prevertebral soft tissues are also somewhat thickened. The proximal trachea is grossly unremarkable. Mild degenerative change is noted along the lower cervical spine. The visualized structures are otherwise unremarkable. The visualized lung apices are grossly clear. IMPRESSION: Thickening of the epiglottis, measuring 1.7 cm, suspicious for epiglottitis. Underlying mild thickening of the prevertebral soft tissues. These results were called by telephone at the time of interpretation on 02/20/2015 at 5:59 am to Dr. Cy Blamer, who verbally acknowledged these results. Electronically Signed   By: Roanna Raider M.D.   On: 02/20/2015 06:00   Dg Chest 2 View  02/19/2015  CLINICAL DATA:  Chest soreness. Cough, shortness of breath and sore throat for 1 day. EXAM: CHEST  2 VIEW COMPARISON:  None. FINDINGS: Lung volumes are low. There are patchy bibasilar opacities, left greater than right. Possible small left pleural effusion. The heart size is upper limits of normal for technique. Vascular congestion without evidence of pulmonary edema, allowing for low lung volumes. No pneumothorax. Postsurgical change in the upper  abdomen with multiple surgical clips. IMPRESSION: Hypoventilatory chest. Patchy bibasilar opacities could reflect atelectasis, pneumonia, or aspiration in the appropriate clinical setting. Possible small left pleural effusion. Electronically Signed   By: Rubye Oaks M.D.   On: 02/19/2015 18:29   I have personally reviewed and evaluated these images and lab results as part of my medical decision-making.   EKG Interpretation   Date/Time:  Monday February 20 2015 05:16:12 EST Ventricular Rate:  106 PR Interval:  157 QRS Duration: 97 QT Interval:  357 QTC Calculation: 474 R Axis:   94 Text Interpretation:  Sinus tachycardia Probable left atrial enlargement  Right axis deviation Confirmed by [facility]  MD, Nicholad Kautzman ([id number]) on  02/20/2015 5:25:22 AM      MDM   Final diagnoses:  Epiglottitis   Medications  clindamycin (CLEOCIN) IVPB 600 mg (0 mg Intravenous Hold 02/20/15 0643)  sodium chloride 0.9 % bolus 2,000 mL (not administered)  ondansetron (ZOFRAN) injection 4 mg (not administered)  lidocaine (XYLOCAINE) 2 % viscous mouth solution 15 mL (15 mLs Mouth/Throat Given by Other 02/20/15 0647)  dexamethasone (DECADRON) injection 10 mg (10 mg Intravenous Given 02/20/15 0702)    Results for orders placed or performed during the hospital encounter of 02/20/15  CBC with Differential/Platelet  Result Value Ref Range   WBC 19.2 (H) 4.0 - 10.5 K/uL   RBC 4.75 4.22 - 5.81 MIL/uL   Hemoglobin 14.7 13.0 - 17.0 g/dL   HCT 60.4 54.0 - 98.1 %   MCV 94.7 78.0 - 100.0 fL   MCH 30.9 26.0 - 34.0 pg   MCHC 32.7 30.0 - 36.0 g/dL   RDW 19.1 (H) 47.8 - 29.5 %   Platelets 598 (H) 150 - 400 K/uL   Neutrophils Relative % 75 %   Neutro Abs 14.3 (H) 1.7 - 7.7 K/uL   Lymphocytes Relative 10 %   Lymphs Abs 1.9 0.7 - 4.0 K/uL   Monocytes Relative 15 %   Monocytes Absolute 2.9 (H) 0.1 - 1.0 K/uL   Eosinophils Relative 0 %   Eosinophils Absolute 0.1 0.0 - 0.7 K/uL   Basophils Relative 0 %   Basophils  Absolute 0.1 0.0 - 0.1 K/uL   Dg Neck Soft Tissue  02/20/2015  CLINICAL DATA:  Acute onset of sore throat and pain with swallowing. Initial encounter. EXAM: NECK SOFT  TISSUES - 1+ VIEW COMPARISON:  None. FINDINGS: The nasopharynx, oropharynx and hypopharynx are unremarkable. The epiglottis appears thickened, measuring 1.7 cm in thickness. Prevertebral soft tissues are also somewhat thickened. The proximal trachea is grossly unremarkable. Mild degenerative change is noted along the lower cervical spine. The visualized structures are otherwise unremarkable. The visualized lung apices are grossly clear. IMPRESSION: Thickening of the epiglottis, measuring 1.7 cm, suspicious for epiglottitis. Underlying mild thickening of the prevertebral soft tissues. These results were called by telephone at the time of interpretation on 02/20/2015 at 5:59 am to Dr. Cy Blamer, who verbally acknowledged these results. Electronically Signed   By: Roanna Raider M.D.   On: 02/20/2015 06:00   Dg Chest 2 View  02/19/2015  CLINICAL DATA:  Chest soreness. Cough, shortness of breath and sore throat for 1 day. EXAM: CHEST  2 VIEW COMPARISON:  None. FINDINGS: Lung volumes are low. There are patchy bibasilar opacities, left greater than right. Possible small left pleural effusion. The heart size is upper limits of normal for technique. Vascular congestion without evidence of pulmonary edema, allowing for low lung volumes. No pneumothorax. Postsurgical change in the upper abdomen with multiple surgical clips. IMPRESSION: Hypoventilatory chest. Patchy bibasilar opacities could reflect atelectasis, pneumonia, or aspiration in the appropriate clinical setting. Possible small left pleural effusion. Electronically Signed   By: Rubye Oaks M.D.   On: 02/19/2015 18:29   MDM Reviewed: previous chart, nursing note and vitals Reviewed previous: x-ray Interpretation: labs, x-ray and ECG (elevated white count first chemistry hemolyzed.   Enlarged epiglottis on XR by me)    To be seen by PCCM Seen by Dr. Pollyann Kennedy of ENT  CRITICAL CARE Performed by: Jasmine Awe Total critical care time: 60  minutes Critical care time was exclusive of separately billable procedures and treating other patients. Critical care was necessary to treat or prevent imminent or life-threatening deterioration. Critical care was time spent personally by me on the following activities: development of treatment plan with patient and/or surrogate as well as nursing, discussions with consultants, evaluation of patient's response to treatment, examination of patient, obtaining history from patient or surrogate, ordering and performing treatments and interventions, ordering and review of laboratory studies, ordering and review of radiographic studies, pulse oximetry and re-evaluation of patient's condition.   Cy Blamer, MD 02/20/15 [phone]

## 2015-02-20 NOTE — ED Provider Notes (Signed)
Patient accepted and signed out pending ICU evaluation. Critical care did not feel patient needed critical care management at this time. On my evaluation the patient was resting without stridor. Notes from previous evaluations reviewed. Discussed with the family medicine resident on for admissions who agreed with patient admission.  Leta BaptistEmily Roe Nguyen, MD 02/20/15 [phone]

## 2015-02-20 NOTE — Consult Note (Signed)
Reason for Consult: Sore throat Referring Physician: April Palumbo, MD  Daniel Wolfe is an 65 y.o. male.  HPI: 2 day history of progressively worsening sore throat. He had severe trouble swallowing and has not eaten or drank anything for 2 days. He is not having trouble breathing. He has some bilious vomiting earlier this morning. He was seen in the emergency department yesterday. The problem got worse since then. He is not on ACE inhibitor.  Past Medical History  Diagnosis Date  . Aneurysm (HCC)   . Clotting disorder (HCC)   . Depressed   . Vitamin D deficiency disease   . Constipation   . GERD (gastroesophageal reflux disease)   . Neuropathy (HCC)   . Urinary retention   . Anxiety   . Spinal paraplegia (HCC)   . Neurogenic bladder   . Benign prostate hyperplasia   . Brain aneurysm     Past Surgical History  Procedure Laterality Date  . Cholecystectomy    . Splenectomy      Family History  Problem Relation Age of Onset  . Lung cancer Mother   . Other Father     lung problems/not sure of cause of death    Social History:  reports that he has never smoked. He has never used smokeless tobacco. He reports that he does not drink alcohol or use illicit drugs.  Allergies:  Allergies  Allergen Reactions  . Penicillins Other (See Comments)    Unknown on MAR  . Senna Nausea And Vomiting  . Sulfa Antibiotics Other (See Comments)    Unknown on MAR    Medications: Reviewed  Results for orders placed or performed during the hospital encounter of 02/20/15 (from the past 48 hour(s))  CBC with Differential/Platelet     Status: Abnormal   Collection Time: 02/20/15  6:36 AM  Result Value Ref Range   WBC 19.2 (H) 4.0 - 10.5 K/uL   RBC 4.75 4.22 - 5.81 MIL/uL   Hemoglobin 14.7 13.0 - 17.0 g/dL   HCT 16.1 09.6 - 04.5 %   MCV 94.7 78.0 - 100.0 fL   MCH 30.9 26.0 - 34.0 pg   MCHC 32.7 30.0 - 36.0 g/dL   RDW 40.9 (H) 81.1 - 91.4 %   Platelets 598 (H) 150 - 400 K/uL    Neutrophils Relative % 75 %   Neutro Abs 14.3 (H) 1.7 - 7.7 K/uL   Lymphocytes Relative 10 %   Lymphs Abs 1.9 0.7 - 4.0 K/uL   Monocytes Relative 15 %   Monocytes Absolute 2.9 (H) 0.1 - 1.0 K/uL   Eosinophils Relative 0 %   Eosinophils Absolute 0.1 0.0 - 0.7 K/uL   Basophils Relative 0 %   Basophils Absolute 0.1 0.0 - 0.1 K/uL    Dg Neck Soft Tissue  02/20/2015  CLINICAL DATA:  Acute onset of sore throat and pain with swallowing. Initial encounter. EXAM: NECK SOFT TISSUES - 1+ VIEW COMPARISON:  None. FINDINGS: The nasopharynx, oropharynx and hypopharynx are unremarkable. The epiglottis appears thickened, measuring 1.7 cm in thickness. Prevertebral soft tissues are also somewhat thickened. The proximal trachea is grossly unremarkable. Mild degenerative change is noted along the lower cervical spine. The visualized structures are otherwise unremarkable. The visualized lung apices are grossly clear. IMPRESSION: Thickening of the epiglottis, measuring 1.7 cm, suspicious for epiglottitis. Underlying mild thickening of the prevertebral soft tissues. These results were called by telephone at the time of interpretation on 02/20/2015 at 5:59 am to Dr. Cy Blamer,  who verbally acknowledged these results. Electronically Signed   By: Roanna RaiderJeffery  Chang M.D.   On: 02/20/2015 06:00   Dg Chest 2 View  02/19/2015  CLINICAL DATA:  Chest soreness. Cough, shortness of breath and sore throat for 1 day. EXAM: CHEST  2 VIEW COMPARISON:  None. FINDINGS: Lung volumes are low. There are patchy bibasilar opacities, left greater than right. Possible small left pleural effusion. The heart size is upper limits of normal for technique. Vascular congestion without evidence of pulmonary edema, allowing for low lung volumes. No pneumothorax. Postsurgical change in the upper abdomen with multiple surgical clips. IMPRESSION: Hypoventilatory chest. Patchy bibasilar opacities could reflect atelectasis, pneumonia, or aspiration in the  appropriate clinical setting. Possible small left pleural effusion. Electronically Signed   By: Rubye OaksMelanie  Ehinger M.D.   On: 02/19/2015 18:29    [id number]:Negative except as listed in admit H&P  Blood pressure 139/86, pulse 105, temperature 97.4 F (36.3 C), temperature source Oral, resp. rate 17, SpO2 93 %.  PHYSICAL EXAM: Overall appearance:  Ill appearing, in no respiratory distress, no stridor. Head:  Normocephalic, atraumatic. Ears: External ears look normal. Nose: External nose is healthy in appearance. Internal nasal exam dry with diffuse crusting material. Oral Cavity/Pharynx:  There are no mucosal lesions or masses identified. Mucosa is severely dry. Tenacious secretions in the pharynx. Larynx/Hypopharynx: Fiberoptic endoscopy, the mucosa is diffusely very dry. Vocal folds are sluggish. There is no obstructing lesion. The epiglottis is also very dry but not inflamed. Neuro:  No identifiable neurologic deficits. Neck: No palpable neck masses. He is tender on both sides of the jugular chain.  Studies Reviewed: Lateral x-ray.  Procedures: Flexible fiberoptic laryngoscopy. Topical viscous lidocaine was applied into the nasal cavities. The scope was passed easily. The larynx and hypopharynx were inspected and the findings were noted above.   Assessment/Plan: Acute pharyngitis. No evidence of airway distress or epiglottitis. He will be admitted to the medical service for antibiotics and intravenous fluids.  Erastus Bartolomei 02/20/2015, 6:59 AM

## 2015-02-20 NOTE — Consult Note (Signed)
PULMONARY / CRITICAL CARE MEDICINE   Name: Daniel Wolfe MRN: [id number] DOB: [birth year]    ADMISSION DATE:  02/20/2015 CONSULTATION DATE:  02/20/2015  REFERRING MD:  Nicanor AlconPalumbo  CHIEF COMPLAINT:  Sore Throat  HISTORY OF PRESENT ILLNESS:   This is a pleasant 65 y/o male with a past history significant for tracheostomy in the past who presented to [facility] on 02/20/2015 complaining of shortness of breath. He stated that the symptoms started 2 days prior to admission. They have been associated with inability to swallow, a sore throat, subjective fevers, and chills. He denies shortness of breath. He denies cough or mucus production. He denies sick contacts. In the emergency department there is concern for epiglottitis so pulmonary and critical care medicine was consulted in addition to ear nose and throat. Dr. Pollyann Kennedyosen with ear nose and throat performed a flexible laryngoscopy and saw no evidence of epiglottitis or impending airway failure.  PAST MEDICAL HISTORY :  He  has a past medical history of Aneurysm (HCC); Clotting disorder (HCC); Depressed; Vitamin D deficiency disease; Constipation; GERD (gastroesophageal reflux disease); Neuropathy (HCC); Urinary retention; Anxiety; Spinal paraplegia (HCC); Neurogenic bladder; Benign prostate hyperplasia; and Brain aneurysm.  PAST SURGICAL HISTORY: He  has past surgical history that includes Cholecystectomy and Splenectomy.  Allergies  Allergen Reactions  . Penicillins Other (See Comments)    Unknown on MAR  . Senna Nausea And Vomiting  . Sulfa Antibiotics Other (See Comments)    Unknown on MAR    No current facility-administered medications on file prior to encounter.   Current Outpatient Prescriptions on File Prior to Encounter  Medication Sig  . acetaminophen (TYLENOL) 325 MG tablet Take 650 mg by mouth 2 (two) times daily.   . baclofen (LIORESAL) 10 MG tablet Take 5 mg by mouth daily.  . bisacodyl (DULCOLAX) 10 MG suppository Place 10  mg rectally daily as needed for moderate constipation.  . bisacodyl (DULCOLAX) 5 MG EC tablet Take 5 mg by mouth daily.   . Cranberry 250 MG CAPS Take 1 capsule by mouth 2 (two) times daily.  . diazepam (VALIUM) 5 MG tablet Take 5 mg by mouth 2 (two) times daily.  . DULoxetine (CYMBALTA) 30 MG capsule Take 30 mg by mouth 3 (three) times daily.   Marland Kitchen. gabapentin (NEURONTIN) 600 MG tablet Take 600 mg by mouth 3 (three) times daily.  Marland Kitchen. loperamide (IMODIUM A-D) 2 MG tablet Take 2 mg by mouth as needed for diarrhea or loose stools.  Marland Kitchen. LORazepam (ATIVAN) 0.5 MG tablet Take 0.5 mg by mouth daily as needed for anxiety (anxiety).   . mirabegron ER (MYRBETRIQ) 50 MG TB24 tablet Take 50 mg by mouth daily.  . mupirocin ointment (BACTROBAN) 2 % Place 1 application into the nose 3 (three) times daily as needed (to supra pubic area to prevent infection).   . ondansetron (ZOFRAN-ODT) 4 MG disintegrating tablet Take 4 mg by mouth every 6 (six) hours as needed for nausea or vomiting.  Marland Kitchen. POLYETHYLENE GLYCOL 3350 PO Take 17 g/mL by mouth daily.   . QUEtiapine (SEROQUEL) 50 MG tablet Take 50-100 mg by mouth 2 (two) times daily. 50 mg in the am and 100 mg at bedtime.  . ranitidine (ZANTAC) 150 MG tablet Take 150 mg by mouth 2 (two) times daily.  . sertraline (ZOLOFT) 100 MG tablet Take 100 mg by mouth daily.  . tamsulosin (FLOMAX) 0.4 MG CAPS capsule Take 0.4 mg by mouth at bedtime.   . traMADol Janean Sark(ULTRAM)  50 MG tablet Take 50 mg by mouth every 6 (six) hours as needed for moderate pain (pain).   Marland Kitchen warfarin (COUMADIN) 6 MG tablet Take 6 mg by mouth daily.  Marland Kitchen azithromycin (ZITHROMAX Z-PAK) 250 MG tablet Take 1 tablet (250 mg total) by mouth daily. 500mg  PO day 1, then 250mg  PO days 205 (Patient not taking: Reported on 02/20/2015)  . oxyCODONE-acetaminophen (PERCOCET/ROXICET) 5-325 MG per tablet Take 1-2 tablets by mouth every 6 (six) hours as needed for severe pain. (Patient not taking: Reported on 11/16/2014)    FAMILY  HISTORY:  His indicated that his mother is deceased. He indicated that his father is deceased.   SOCIAL HISTORY: He  reports that he has never smoked. He has never used smokeless tobacco. He reports that he does not drink alcohol or use illicit drugs.  REVIEW OF SYSTEMS:   Gen: Notes fever, chills, denies weight change, fatigue, night sweats HEENT: per HPI PULM: per HPI CV: Denies chest pain, edema, orthopnea, paroxysmal nocturnal dyspnea, palpitations GI: Denies abdominal pain, nausea, vomiting, diarrhea, hematochezia, melena, constipation, change in bowel habits GU: Denies dysuria, hematuria, polyuria, oliguria, urethral discharge Endocrine: Denies hot or cold intolerance, polyuria, polyphagia or appetite change Derm: Denies rash, dry skin, scaling or peeling skin change Heme: Denies easy bruising, bleeding, bleeding gums Neuro: Denies headache, numbness, weakness, slurred speech, loss of memory or consciousness   SUBJECTIVE: as above VITAL SIGNS: BP 141/82 mmHg  Pulse 106  Temp(Src) 98.3 F (36.8 C) (Oral)  Resp 18  SpO2 94%  HEMODYNAMICS:    VENTILATOR SETTINGS:    INTAKE / OUTPUT:    PHYSICAL EXAMINATION: General: Mildly ill appearing Neuro:  Awake, alert, no acute distress  HEENT:  NCAT,OP:dry mucus membranes, left tonsil: erythematous, swollen, purulent drainage, tender anterior cervical lymphadenopathy left side, old trach scar noted Cardiovascular:  Slightly tachycardic, no mgr Lungs:  CTA B, normal effort Abdomen:  BS+, soft, nontender Musculoskeletal:  Normal bulk and tone Skin:  No rash or skin breakdown  LABS:  BMET  Recent Labs Lab 02/20/15 0710  NA 138  K 4.2  CL 105  CO2 24  BUN 13  CREATININE 1.18  GLUCOSE 105*    Electrolytes  Recent Labs Lab 02/20/15 0710  CALCIUM 9.0    CBC  Recent Labs Lab 02/20/15 0636  WBC 19.2*  HGB 14.7  HCT 45.0  PLT 598*    Coag's No results for input(s): APTT, INR in the last 168  hours.  Sepsis Markers  Recent Labs Lab 02/20/15 0655  LATICACIDVEN 1.33    ABG No results for input(s): PHART, PCO2ART, PO2ART in the last 168 hours.  Liver Enzymes No results for input(s): AST, ALT, ALKPHOS, BILITOT, ALBUMIN in the last 168 hours.  Cardiac Enzymes No results for input(s): TROPONINI, PROBNP in the last 168 hours.  Glucose No results for input(s): GLUCAP in the last 168 hours.  Imaging Dg Neck Soft Tissue  02/20/2015  CLINICAL DATA:  Acute onset of sore throat and pain with swallowing. Initial encounter. EXAM: NECK SOFT TISSUES - 1+ VIEW COMPARISON:  None. FINDINGS: The nasopharynx, oropharynx and hypopharynx are unremarkable. The epiglottis appears thickened, measuring 1.7 cm in thickness. Prevertebral soft tissues are also somewhat thickened. The proximal trachea is grossly unremarkable. Mild degenerative change is noted along the lower cervical spine. The visualized structures are otherwise unremarkable. The visualized lung apices are grossly clear. IMPRESSION: Thickening of the epiglottis, measuring 1.7 cm, suspicious for epiglottitis. Underlying mild thickening of the prevertebral  soft tissues. These results were called by telephone at the time of interpretation on 02/20/2015 at 5:59 am to Dr. Cy Blamer, who verbally acknowledged these results. Electronically Signed   By: Roanna Raider M.D.   On: 02/20/2015 06:00   Dg Chest 2 View  02/19/2015  CLINICAL DATA:  Chest soreness. Cough, shortness of breath and sore throat for 1 day. EXAM: CHEST  2 VIEW COMPARISON:  None. FINDINGS: Lung volumes are low. There are patchy bibasilar opacities, left greater than right. Possible small left pleural effusion. The heart size is upper limits of normal for technique. Vascular congestion without evidence of pulmonary edema, allowing for low lung volumes. No pneumothorax. Postsurgical change in the upper abdomen with multiple surgical clips. IMPRESSION: Hypoventilatory chest. Patchy  bibasilar opacities could reflect atelectasis, pneumonia, or aspiration in the appropriate clinical setting. Possible small left pleural effusion. Electronically Signed   By: Rubye Oaks M.D.   On: 02/19/2015 18:29     STUDIES:  1/2 Neck soft tissue X-ray: possible epiglotitis  CULTURES: 1/2 blood culture  1/2 throat culture >   ANTIBIOTICS: 1/2 ceftriaxone>   SIGNIFICANT EVENTS: 1/2 ENT consult> flex laryngoscopy  LINES/TUBES:   DISCUSSION: 65 y/o male presented to [facility] on 02/20/2015 with a sore throat.  His lack of cough, tender anterior cervical lymphadenopathy, purulent drainage from left tonsil, and subjective fever meet 4/4 Centor criteria for strep pharyngitis.  No worrisome findings for Ludwig's angina or Lemierre syndrome based on history or physical exam.  Throat culture would be of some value as anaerobic organisms can cause acute pharyngitis. Less likely considerations would be a viral pharyngitis, acute HIV, Toxoplasmosis, EBV, or CMV.    Rec: Throat culture Antibiotics> given PCN allergy ceftriaxone is reasonable, consider augmentin if he does not respond to therapy Pain control> consider chlorseptic spray If not swallowing then consider hospital admission, but this is a common complain with pharyngitis and would see if he can swallow after giving pain control here  Will sign off, call if questions  [place], MD [place] [facility] Pager: [phone] Cell: [phone] After 3pm or if no response, call [phone]

## 2015-02-20 NOTE — H&P (Signed)
Family Medicine Teaching [facility] Admission History and Physical Service Pager: [phone]  [name] Medical record number: [id number] Date of birth: 01-23-1951 Age: 65 y.o. Gender: male  Primary Care Provider: MCDIARMID,TODD D, MD Consultants: CCM (signed off) Code Status: DNR (per discussion on admission)  Chief Complaint: sore throat  Assessment and Plan: JEFFIE SPIVACK is a 65 y.o. male presenting with sore throat . PMH is significant for Aneurysm, Clotting disorder, Depression, Vitamin D deficiency disease, Constipation, GERD, Neuropathy, Urinary retention, Anxiety, Spinal paraplegia, Neurogenic bladder, Benign prostate hyperplasia, and Brain aneurysm.  Acute Pharyngitis with difficulty swallowing: Likely acute viral vs bacterial pharyngitis. Associated with trouble swallowing upon presentation, reported fever, chills, and neck tenderness. No palpable lymphadenopathy on exam. Flexible laryngoscopy ruled out epiglottitis or airway distress. Centor criteria: 2 (fever and absence of cough). According to patient he has not ate of drank anything in 4 days. Currently afebrile with stable vital signs. WBC 19.2. BMP WNL. CCM also indicating that most likely acute pharyngitis and can be treated with antibiotics.   - Admit to FPTS, attending Dr. Deirdre Priest - Rapid strep test pending  - Will treat with an antibiotic if rapid strep positive - Start IVF due to no PO intake. Additionally patient has not drank water in "10 years" and only drinks [facility] - s/p IV Clindamycin, received 1 dose. Begin IV CTX - Chloraseptic PRN for throat pain relief - F/u throat culture  Urinary Retention with Suprapubic Cath: Neurogenic bladder due to paraplegia. Last changed December 15th, not due for a new one.  - Continue to ensure cath site is clean and has good urine output  Protein S deficiency: Problem not in Epic but per patient history. Has had DVT in past. Is currently on Coumadin  for anticoagulation.  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 Does not have complete loss of his movement and strength in his lower extremities but unable to walk and has to use a wheelchair. - Continue home Gabapentin 600 mg TID  FEN/GI: MIVF NS @ 100 cc/hr, heart healthy diet  Prophylaxis: Coumadin  Disposition: Admit to med surg , attending Dr. Deirdre Priest  History of Present Illness:  Daniel Wolfe is a 65 y.o. male presenting with sore throat.   Patient states he has had a sore throat for the last 2 days. Sore throat associated with inability to swallow, fever, and chills. Has not taken anything for the sore throat at home. Currently he denies shortness of breath, cough, or sick contacts. Admits to nausea and vomiting this morning that was yellow bile. Has not eaten or had a bowel movement for the last 4 days. Has not had any water in 10 years, he drinks Diet Mountain. Dew. Pt does not have a PCP. Pt lives at an ALF [facility]) which gives him his medications. Pt does not ambulate, he uses a wheelchair. He is able to stand but cannot walk. Has suprapubic cath, last time it was changed was 12/15.   [facility] ED course: There was concern for epiglottitis due to neck X-RAY, so pulmonary and critical care medicine were consulted in addition to ear nose and throat. Dr. Pollyann Kennedy  performed a flexible laryngoscopy and saw no evidence of epiglottitis or impending airway failure. Blood and throat cultures taken.   Review Of Systems: Per HPI with the following additions: No fever per patient  Otherwise the remainder of the systems were negative.  Patient Active Problem List   Diagnosis Date Noted  . Epiglottitis 02/20/2015  . Acute pharyngitis 02/20/2015  . 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 SAH (subarachnoid hemorrhage) (HCC) 08/23/2013  . Paraplegia (HCC) 08/23/2013    Past  Medical History: Past Medical History  Diagnosis Date  . Aneurysm (HCC)   . Clotting disorder (HCC)   . Depressed   . Vitamin D deficiency disease   . Constipation   . GERD (gastroesophageal reflux disease)   . Neuropathy (HCC)   . Urinary retention   . Anxiety   . Spinal paraplegia (HCC)   . Neurogenic bladder   . Benign prostate hyperplasia   . Brain aneurysm   Protein S deficiency DVT 4-5 years ago Suprapubic Cath   Past Surgical History: Past Surgical History  Procedure Laterality Date  . Cholecystectomy    . Splenectomy      Social History: Social History  Substance Use Topics  . Smoking status: Never Smoker   . Smokeless tobacco: Never Used  . Alcohol Use: No  smoked cigarettes from 6-18 years Alcohol: drinks "as much as I can get". Currently he only drinks once a month.  Illicit drugs: Marijuana, smoked daily since 18 years ago  Additional social history: Pt lives at an ALF [facility]) Please also refer to relevant sections of EMR.  Family History: Family History  Problem Relation Age of Onset  . Lung cancer Mother   . Other Father     lung problems/not sure of cause of death  Mother: Protein C deficiency   Allergies and Medications: Allergies  Allergen Reactions  . Penicillins Other (See Comments)    Unknown on MAR  . Senna Nausea And Vomiting  . Sulfa Antibiotics Other (See Comments)    Unknown on MAR   No current facility-administered medications on file prior to encounter.   Current Outpatient Prescriptions on File Prior to Encounter  Medication Sig Dispense Refill  . acetaminophen (TYLENOL) 325 MG tablet Take 650 mg by mouth 2 (two) times daily.     . baclofen (LIORESAL) 10 MG tablet Take 5 mg by mouth daily.    . bisacodyl (DULCOLAX) 10 MG suppository Place 10 mg rectally daily as needed for moderate constipation.    . bisacodyl (DULCOLAX) 5 MG EC tablet Take 5 mg by mouth daily.     . Cranberry 250 MG CAPS Take 1 capsule by mouth 2  (two) times daily.    . diazepam (VALIUM) 5 MG tablet Take 5 mg by mouth 2 (two) times daily.    . DULoxetine (CYMBALTA) 30 MG capsule Take 30 mg by mouth 3 (three) times daily.     Marland Kitchen gabapentin (NEURONTIN) 600 MG tablet Take 600 mg by mouth 3 (three) times daily.    Marland Kitchen loperamide (IMODIUM A-D) 2 MG tablet Take 2 mg by mouth as needed for diarrhea or loose stools.    Marland Kitchen LORazepam (ATIVAN) 0.5 MG tablet Take 0.5 mg by mouth daily as needed for anxiety (anxiety).     . mirabegron ER (MYRBETRIQ) 50 MG TB24 tablet Take 50 mg by mouth daily.    . mupirocin ointment (BACTROBAN) 2 % Place 1 application into the nose 3 (three) times daily as needed (to supra pubic area to prevent infection).     . ondansetron (ZOFRAN-ODT) 4 MG disintegrating tablet Take 4 mg by mouth every 6 (six) hours as needed for nausea or vomiting.    Marland Kitchen  POLYETHYLENE GLYCOL 3350 PO Take 17 g/mL by mouth daily.     . QUEtiapine (SEROQUEL) 50 MG tablet Take 50-100 mg by mouth 2 (two) times daily. 50 mg in the am and 100 mg at bedtime.    . ranitidine (ZANTAC) 150 MG tablet Take 150 mg by mouth 2 (two) times daily.    . sertraline (ZOLOFT) 100 MG tablet Take 100 mg by mouth daily.    . tamsulosin (FLOMAX) 0.4 MG CAPS capsule Take 0.4 mg by mouth at bedtime.     . traMADol (ULTRAM) 50 MG tablet Take 50 mg by mouth every 6 (six) hours as needed for moderate pain (pain).     Marland Kitchen warfarin (COUMADIN) 6 MG tablet Take 6 mg by mouth daily.    Marland Kitchen azithromycin (ZITHROMAX Z-PAK) 250 MG tablet Take 1 tablet (250 mg total) by mouth daily. 500mg  PO day 1, then 250mg  PO days 205 (Patient not taking: Reported on 02/20/2015) 6 tablet 0  . oxyCODONE-acetaminophen (PERCOCET/ROXICET) 5-325 MG per tablet Take 1-2 tablets by mouth every 6 (six) hours as needed for severe pain. (Patient not taking: Reported on 11/16/2014) 12 tablet 0    Objective: BP 144/97 mmHg  Pulse 100  Temp(Src) 98 F (36.7 C) (Axillary)  Resp 17  Ht 5\' 10"  (1.778 m)  Wt 224 lb 13.9 oz  (102 kg)  BMI 32.27 kg/m2  SpO2 94% Exam: General: In NAD, laying in bed, awake and alert Eyes: Extraocular movements intact, clear conjunctiva ENTM: moist mucous membranes, pharynx not well visualized due to patient inability to open mouth completely. No oral lesions noted, edentulous Neck: supple and tender to palpation bilaterally. No lymphadenopathy appreciated Cardiovascular: Tachycardic, regular rhythm, normal s1 s2, no rubs, gallops, or murmurs Respiratory: clear to ausculation bilaterally, no increased work of breathing Abdomen: soft, non distended, non tender, normal bowel sounds, no masses palpated. Suprapubic catheter in place MSK: Patient able to move lower extremities with knee flexion and some plantar and dorsal flexion. Skin: No rashes  Neuro: 5/5 strength in upper extremities, 3-4/ 5 strength with plantar and dorsal flexion  Psych: normal mood and affect  Labs and Imaging: CBC BMET   Recent Labs Lab 02/20/15 0636  WBC 19.2*  HGB 14.7  HCT 45.0  PLT 598*    Recent Labs Lab 02/20/15 0710  NA 138  K 4.2  CL 105  CO2 24  BUN 13  CREATININE 1.18  GLUCOSE 105*  CALCIUM 9.0     Dg Neck Soft Tissue  02/20/2015  CLINICAL DATA:  Acute onset of sore throat and pain with swallowing. Initial encounter. EXAM: NECK SOFT TISSUES - 1+ VIEW COMPARISON:  None. FINDINGS: The nasopharynx, oropharynx and hypopharynx are unremarkable. The epiglottis appears thickened, measuring 1.7 cm in thickness. Prevertebral soft tissues are also somewhat thickened. The proximal trachea is grossly unremarkable. Mild degenerative change is noted along the lower cervical spine. The visualized structures are otherwise unremarkable. The visualized lung apices are grossly clear. IMPRESSION: Thickening of the epiglottis, measuring 1.7 cm, suspicious for epiglottitis. Underlying mild thickening of the prevertebral soft tissues. These results were called by telephone at the time of interpretation on  02/20/2015 at 5:59 am to Dr. Cy Blamer, who verbally acknowledged these results. Electronically Signed   By: Roanna Raider M.D.   On: 02/20/2015 06:00   Dg Chest 2 View  02/19/2015  CLINICAL DATA:  Chest soreness. Cough, shortness of breath and sore throat for 1 day. EXAM: CHEST  2 VIEW COMPARISON:  None. FINDINGS: Lung volumes are low. There are patchy bibasilar opacities, left greater than right. Possible small left pleural effusion. The heart size is upper limits of normal for technique. Vascular congestion without evidence of pulmonary edema, allowing for low lung volumes. No pneumothorax. Postsurgical change in the upper abdomen with multiple surgical clips. IMPRESSION: Hypoventilatory chest. Patchy bibasilar opacities could reflect atelectasis, pneumonia, or aspiration in the appropriate clinical setting. Possible small left pleural effusion. Electronically Signed   By: Rubye OaksMelanie  Ehinger M.D.   On: 02/19/2015 18:29     Beaulah Dinninghristina M Gambino, MD 02/20/2015, 3:06 PM PGY-1, [place] [facility] FPTS Intern pager: [phone], text pages welcome  Upper Level Addendum:  I have seen and evaluated this patient along with Dr. Jonathon JordanGambino and reviewed the above note, making necessary revisions in [facility].   Clare GandyJeremy Schmitz, MD Family Medicine PGY-3

## 2015-02-21 ENCOUNTER — Encounter (HOSPITAL_COMMUNITY): Payer: Self-pay | Admitting: *Deleted

## 2015-02-21 DIAGNOSIS — J029 Acute pharyngitis, unspecified: Secondary | ICD-10-CM

## 2015-02-21 LAB — PROTIME-INR
INR: 3.92 — ABNORMAL HIGH (ref 0.00–1.49)
PROTHROMBIN TIME: 37.4 s — AB (ref 11.6–15.2)

## 2015-02-21 LAB — CBC
HCT: 47.1 % (ref 39.0–52.0)
Hemoglobin: 15 g/dL (ref 13.0–17.0)
MCH: 29.9 pg (ref 26.0–34.0)
MCHC: 31.8 g/dL (ref 30.0–36.0)
MCV: 94 fL (ref 78.0–100.0)
PLATELETS: 346 10*3/uL (ref 150–400)
RBC: 5.01 MIL/uL (ref 4.22–5.81)
RDW: 18.3 % — AB (ref 11.5–15.5)
WBC: 19.8 10*3/uL — ABNORMAL HIGH (ref 4.0–10.5)

## 2015-02-21 LAB — APTT: APTT: 57 s — AB (ref 24–37)

## 2015-02-21 MED ORDER — MUPIROCIN 2 % EX OINT
1.0000 "application " | TOPICAL_OINTMENT | Freq: Two times a day (BID) | CUTANEOUS | Status: DC
  Administered 2015-02-21 – 2015-02-24 (×6): 1 via NASAL
  Filled 2015-02-21: qty 22

## 2015-02-21 MED ORDER — PROMETHAZINE HCL 25 MG/ML IJ SOLN
12.5000 mg | Freq: Four times a day (QID) | INTRAMUSCULAR | Status: DC | PRN
  Administered 2015-02-21 – 2015-02-22 (×3): 12.5 mg via INTRAVENOUS
  Filled 2015-02-21 (×3): qty 1

## 2015-02-21 MED ORDER — CHLORHEXIDINE GLUCONATE CLOTH 2 % EX PADS
6.0000 | MEDICATED_PAD | Freq: Every day | CUTANEOUS | Status: DC
  Administered 2015-02-22 – 2015-02-24 (×3): 6 via TOPICAL

## 2015-02-21 MED ORDER — WARFARIN - PHARMACIST DOSING INPATIENT: Freq: Every day | Status: DC

## 2015-02-21 NOTE — Progress Notes (Signed)
Family Medicine Teaching Service Daily Progress Note Intern Pager: [phone]  [name] Medical record number: [id number] Date of birth: Jan 30, 1951 Age: 65 y.o. Gender: male  Primary Care Provider: MCDIARMID,TODD D, MD Consultants: None Code Status: Full code  Pt Overview and Major Events to Date:  1/2: patient admitted  Assessment and Plan: Daniel Wolfe is a 65 y.o. male presenting with sore throat . PMH is significant for Aneurysm, Clotting disorder, Depression, Vitamin D deficiency disease, Constipation, GERD, Neuropathy, Urinary retention, Anxiety, Spinal paraplegia, Neurogenic bladder, Benign prostate hyperplasia, and Brain aneurysm.  Acute Pharyngitis with difficulty swallowing: Rapid strep negative. Low centor criteria score. Patient has been nauseated since starting antibiotics. Pharyngitis likely viral at this point.  - discontinue antibiotic therapy - phenergan prn for nausea - continue IVF due to decreased PO intake. Additionally patient has not drank water in "10 years" and only drinks Dt. Mountain Dew - Chloraseptic PRN for throat pain relief - F/u throat culture  Urinary Retention with Suprapubic Cath: Neurogenic bladder due to paraplegia. Last changed December 15th, not due for a new one.  - Continue to ensure cath site is clean and has good urine output  Protein S deficiency: Problem not in Epic but per patient history. Has had DVT in past. Is currently on Coumadin for anticoagulation.  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 Does not have complete loss of his movement and strength in his lower extremities but unable to walk and has to use a wheelchair. - Continue home Gabapentin 600 mg TID  FEN/GI: MIVF NS @ 100 cc/hr, heart healthy diet Prophylaxis: Coumadin  Disposition: ALF pending improvement of nausea  Subjective:   Patient reports vomiting a lot overnight. He had just received phenergan as zofran has not helped. No chest pain or dyspnea. Some slight abdominal pain after vomiting all night.  Objective: Temp:  [98 F (36.7 C)-98.7 F (37.1 C)] 98.1 F (36.7 C) (01/03 0521) Pulse Rate:  [86-103] 86 (01/03 0521) Resp:  [10-20] 16 (01/03 0521) BP: (126-149)/(84-98) 147/92 mmHg (01/03 0521) SpO2:  [91 %-99 %] 96 % (01/03 0521) Weight:  [224 lb 13.9 oz (102 kg)] 224 lb 13.9 oz (102 kg) (01/02 1345)   Physical Exam: General: Fair appearing, no distress, bag at bedside with emesis Cardiovascular: Regular rate and rhythm. Normal S1 and S2. No heart murmurs present. No extra heart sounds Respiratory: Clear to auscultation, however, diminished throughout Abdomen: Soft, non-tender, non-distended Extremities: no edema  Laboratory:  Recent Labs Lab 02/20/15 0636 02/21/15 0540  WBC 19.2* 19.8*  HGB 14.7 15.0  HCT 45.0 47.1  PLT 598* 346    Recent Labs Lab 02/20/15 0710  NA 138  K 4.2  CL 105  CO2 24  BUN 13  CREATININE 1.18  CALCIUM 9.0  GLUCOSE 105*    Imaging/Diagnostic Tests: Dg Neck Soft Tissue  02/20/2015  CLINICAL DATA:  Acute onset of sore throat and pain with swallowing. Initial encounter. EXAM: NECK SOFT TISSUES - 1+ VIEW COMPARISON:  None. FINDINGS: The nasopharynx, oropharynx and hypopharynx are unremarkable. The epiglottis appears thickened, measuring 1.7 cm in thickness. Prevertebral soft tissues are also somewhat thickened. The proximal trachea is grossly unremarkable. Mild degenerative change is noted along the lower cervical spine. The visualized structures are otherwise unremarkable. The visualized lung apices are grossly clear. IMPRESSION: Thickening of the epiglottis, measuring 1.7 cm, suspicious for  epiglottitis. Underlying mild thickening of the prevertebral soft tissues. These results were called by telephone at the time of interpretation on 02/20/2015 at 5:59 am to Dr. Cy Blamer, who verbally acknowledged these results. Electronically Signed   By: Roanna Raider M.D.   On: 02/20/2015 06:00   Dg Chest 2 View  02/19/2015  CLINICAL DATA:  Chest soreness. Cough, shortness of breath and sore throat for 1 day. EXAM: CHEST  2 VIEW COMPARISON:  None. FINDINGS: Lung volumes are low. There are patchy bibasilar opacities, left greater than right. Possible small left pleural effusion. The heart size is upper limits of normal for technique. Vascular congestion without evidence of pulmonary edema, allowing for low lung volumes. No pneumothorax. Postsurgical change in the upper abdomen with multiple surgical clips. IMPRESSION: Hypoventilatory chest. Patchy bibasilar opacities could reflect atelectasis, pneumonia, or aspiration in the appropriate clinical setting. Possible small left pleural effusion. Electronically Signed   By: Rubye Oaks M.D.   On: 02/19/2015 18:29    Narda Bonds, MD 02/21/2015, 11:06 AM PGY-3, [place] [facility] FPTS Intern pager: [phone], text pages welcome

## 2015-02-21 NOTE — Progress Notes (Signed)
12.5mg  iv phenegharn administered for c/o nausea

## 2015-02-21 NOTE — Clinical Social Work Note (Signed)
CSW received referral stating patient admitted from facility [facility]). CSW contacted facility who states patient has been a resident at their facility for approximately one year. [facility] anticipates patient to return once medically stable for discharge.  CSW to continue to follow and assist with discharge planning needs.  Daniel Deistmily Mackson Botz, LCSW [phone] Orthopedics: [phone] Surgical: [phone]

## 2015-02-21 NOTE — Progress Notes (Signed)
Pt has not voiced c/o nausea since this morning

## 2015-02-21 NOTE — Progress Notes (Signed)
ANTICOAGULATION CONSULT NOTE - Follow Up Consult  Pharmacy Consult for Coumadin (chronic) Indication: Protein S deficiency and history of DVT  Allergies  Allergen Reactions  . Penicillins Other (See Comments)    Unknown on MAR  . Senna Nausea And Vomiting  . Sulfa Antibiotics Other (See Comments)    [facility] on [facility]   Patient Measurements: Height: 5\' 10"  (177.8 cm) Weight: 224 lb 13.9 oz (102 kg) IBW/kg (Calculated) : 73 Vital Signs: Temp: 98.5 F (36.9 C) (01/03 1252) Temp Source: Oral (01/03 1252) BP: 153/91 mmHg (01/03 1252) Pulse Rate: 88 (01/03 1252) Labs:  Recent Labs  02/20/15 0636 02/20/15 0710 02/20/15 1700 02/21/15 0540  HGB 14.7  --   --  15.0  HCT 45.0  --   --  47.1  PLT 598*  --   --  346  APTT  --   --   --  57*  LABPROT  --   --  34.6* 37.4*  INR  --   --  3.53* 3.92*  CREATININE  --  1.18  --   --    Estimated Creatinine Clearance: 77.7 mL/min (by C-G formula based on Cr of 1.18).  Assessment: 65 year old male on chronic Coumadin for Protein S deficiency and history of a DVT admitted from assisted living facility for sore throat.   PTA dose was 6mg  daily.   INR on admission was elevated at 3.53 and continues to rise today at 3.92 despite holding Coumadin. Last dose per assisted living center MAG was on 12/31.  Patient reports being unable to eat for 4 days contributing to INR.   Goal of Therapy:  INR 2-3 Monitor platelets by anticoagulation protocol: Yes   Plan:  No Coumadin tonight.  Daily PT/INR.   Link SnufferJessica Junious Ragone, PharmD, BCPS Clinical Pharmacist [phone] 02/21/2015,1:02 PM

## 2015-02-21 NOTE — Progress Notes (Signed)
Plan is to keep pt overnight, monitor and discharge tomorrow per MD

## 2015-02-21 NOTE — Progress Notes (Addendum)
Pt has been vomiting , bilious like in color, paged family medicine to report with order of zofran IV and promethazine tab

## 2015-02-21 NOTE — Progress Notes (Signed)
Called family medicine to report that pt still vomitting that pt did not have relief from zofran and cannot hold down promethazine tablet, no further orders been made.

## 2015-02-21 NOTE — Progress Notes (Signed)
12.5mg  iv phenerghan ordered and administered for nausea

## 2015-02-22 DIAGNOSIS — J810 Acute pulmonary edema: Secondary | ICD-10-CM | POA: Diagnosis not present

## 2015-02-22 DIAGNOSIS — J189 Pneumonia, unspecified organism: Secondary | ICD-10-CM | POA: Diagnosis not present

## 2015-02-22 DIAGNOSIS — J029 Acute pharyngitis, unspecified: Secondary | ICD-10-CM | POA: Diagnosis not present

## 2015-02-22 LAB — CBC WITH DIFFERENTIAL/PLATELET
Basophils Absolute: 0 10*3/uL (ref 0.0–0.1)
Basophils Relative: 0 %
EOS PCT: 0 %
Eosinophils Absolute: 0 10*3/uL (ref 0.0–0.7)
HCT: 45.3 % (ref 39.0–52.0)
Hemoglobin: 14.6 g/dL (ref 13.0–17.0)
LYMPHS ABS: 2.4 10*3/uL (ref 0.7–4.0)
Lymphocytes Relative: 14 %
MCH: 30.4 pg (ref 26.0–34.0)
MCHC: 32.2 g/dL (ref 30.0–36.0)
MCV: 94.2 fL (ref 78.0–100.0)
MONO ABS: 2.2 10*3/uL — AB (ref 0.1–1.0)
Monocytes Relative: 13 %
NEUTROS ABS: 12.4 10*3/uL — AB (ref 1.7–7.7)
Neutrophils Relative %: 73 %
PLATELETS: 395 10*3/uL (ref 150–400)
RBC: 4.81 MIL/uL (ref 4.22–5.81)
RDW: 18.5 % — AB (ref 11.5–15.5)
WBC: 17 10*3/uL — AB (ref 4.0–10.5)

## 2015-02-22 LAB — COMPREHENSIVE METABOLIC PANEL
ALT: 9 U/L — AB (ref 17–63)
ANION GAP: 10 (ref 5–15)
AST: 13 U/L — ABNORMAL LOW (ref 15–41)
Albumin: 2.8 g/dL — ABNORMAL LOW (ref 3.5–5.0)
Alkaline Phosphatase: 65 U/L (ref 38–126)
BUN: 12 mg/dL (ref 6–20)
CHLORIDE: 111 mmol/L (ref 101–111)
CO2: 22 mmol/L (ref 22–32)
CREATININE: 0.81 mg/dL (ref 0.61–1.24)
Calcium: 8.7 mg/dL — ABNORMAL LOW (ref 8.9–10.3)
GFR calc non Af Amer: >60 mL/min (ref >60)
Glucose, Bld: 88 mg/dL (ref 65–99)
POTASSIUM: 4.1 mmol/L (ref 3.5–5.1)
SODIUM: 143 mmol/L (ref 135–145)
Total Bilirubin: 0.8 mg/dL (ref 0.3–1.2)
Total Protein: 6.7 g/dL (ref 6.5–8.1)

## 2015-02-22 LAB — PROTIME-INR
INR: 3.74 — AB (ref 0.00–1.49)
Prothrombin Time: 36.1 seconds — ABNORMAL HIGH (ref 11.6–15.2)

## 2015-02-22 LAB — LIPASE, BLOOD: LIPASE: 19 U/L (ref 11–51)

## 2015-02-22 LAB — CULTURE, GROUP A STREP: STREP A CULTURE: NEGATIVE

## 2015-02-22 MED ORDER — WHITE PETROLATUM GEL
Status: AC
  Administered 2015-02-22: 14:00:00
  Filled 2015-02-22: qty 1

## 2015-02-22 MED ORDER — PHENOL 1.4 % MT LIQD
1.0000 | OROMUCOSAL | Status: DC | PRN
  Administered 2015-02-22 (×5): 1 via OROMUCOSAL
  Filled 2015-02-22: qty 177

## 2015-02-22 NOTE — Progress Notes (Signed)
Family Medicine Teaching Service Daily Progress Note Intern Pager: [phone]  [name] Medical record number: [id number] Date of birth: Feb 24, 1950 Age: 65 y.o. Gender: male  Primary Care Provider: MCDIARMID,TODD D, MD Consultants: None Code Status: Full code  Pt Overview and Major Events to Date:  1/2: patient admitted 1/3: abx discontinued  Assessment and Plan: Daniel Wolfe is a 65 y.o. male presenting with sore throat . PMH is significant for Aneurysm, Clotting disorder, Depression, Vitamin D deficiency disease, Constipation, GERD, Neuropathy, Urinary retention, Anxiety, Spinal paraplegia, Neurogenic bladder, Benign prostate hyperplasia, and Brain aneurysm.  Acute Pharyngitis with difficulty swallowing: Rapid strep negative. Low centor criteria score. Patient has been nauseated since starting antibiotics. Pharyngitis likely viral at this point. antibiotic therapy was discontinued yesterday. Sore throat improving.  - IV phenergan prn for nausea; Continues to be nauseated. 700cc emesis in last 24 hours - continue maintenance IVF due to decreased PO intake. Additionally patient has not drank water in "10 years" and only drinks Dt. Mountain Dew - Chloraseptic PRN for throat pain relief - F/u throat culture  Leukocytosis: WBC 9.4>>19.2>>19.8. Likely secondary to pharyngitis. Patient has been afebrile with stable vital signs.  - F/u CBC w/ diff  Protein S deficiency with elevated INR: Problem not in Epic but per patient history. Has had DVT in past. Is currently on Coumadin for anticoagulation. Coumadin has been held since hospitalization due to supra therapeutic levels. Last dose per ALF was on 12/31. INR 3.74 today, was 3.92 yesterday. - Continue coumadin per pharmacy - daily INR - CMP and Lipase to evaluate hepatic function  Urinary Retention with Suprapubic Cath: Neurogenic bladder due to paraplegia. Last changed December 15th, not due for a new one.  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 Does not have complete loss of his movement and strength in his lower extremities but unable to walk and has to use a wheelchair. - Continue home Gabapentin 600 mg TID  FEN/GI: MIVF NS @ 100 cc/hr, heart healthy diet Prophylaxis: Coumadin (currenlty being held)  Disposition: ALF pending improvement of PO intake  Subjective:  - Patient reports vomiting a lot overnight, continues to be nauseas but is resolved with phenergan. - He noticed some bood tinge to his vomit.  - Still not eating or drinking due to nausea - No chest pain or dyspnea.  - Denies abdominal pain at this time.   Objective: Temp:  [98 F (36.7 C)-99.1 F (37.3 C)] 98 F (36.7 C) (01/04 0601) Pulse Rate:  [84-93] 84 (01/04 0601) Resp:  [18-19] 18 (01/04 0601) BP: (142-153)/(80-92) 142/80 mmHg (01/04 0601) SpO2:  [87 %-97 %] 97 % (01/04 0601)   Physical Exam: General: Fair appearing, no distress, bag at bedside with emesis. Some blood tinged secretions next to right corner of mouth from emesis. Cardiovascular: Regular rate and rhythm. Normal S1 and S2. No heart murmurs present. No extra heart sounds Respiratory: Clear to auscultation, however, diminished throughout Abdomen: Soft, non-tender, non-distended. Suprapubic catheter in place and draining Extremities: no edema  Laboratory:  Recent Labs Lab 02/20/15 0636 02/21/15 0540  WBC 19.2* 19.8*  HGB 14.7 15.0  HCT 45.0 47.1  PLT 598* 346    Recent Labs Lab 02/20/15 0710  NA 138  K 4.2  CL 105  CO2 24  BUN 13  CREATININE 1.18  CALCIUM 9.0  GLUCOSE 105*    Imaging/Diagnostic Tests: No results found.  Beaulah Dinninghristina M Gambino, MD 02/22/2015, 9:23 AM PGY-1, [place] [facility] FPTS Intern pager: [phone], text pages welcome

## 2015-02-22 NOTE — NC FL2 (Signed)
[place] MEDICAID FL2 LEVEL OF CARE SCREENING TOOL     IDENTIFICATION  [name] Birthdate: 27-Jun-1950 Sex: male Admission Date (Current Location): 02/20/2015  [facility] and [place] Number:  Producer, television/film/video and Address:  [facility], [address] [place], [place] [address]      Provider Number: [id number]  Attending Physician Name and Address:  Carney Living, MD  Relative Name and Phone Number:       Current Level of Care: Hospital Recommended Level of Care: Assisted Living Facility Prior Approval Number:    Date Approved/Denied:   PASRR Number: [id number]  Discharge Plan: Other (Comment) (Return to ALF)    Current Diagnoses: Patient Active Problem List   Diagnosis Date Noted  . Epiglottitis 02/20/2015  . Acute pharyngitis 02/20/2015  . Sore throat 02/20/2015  . 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 SAH (subarachnoid hemorrhage) (HCC) 08/23/2013  . Paraplegia (HCC) 08/23/2013    Orientation RESPIRATION BLADDER Height & Weight    Self, Time, Situation, Place  Normal Incontinent 5\' 10"  (177.8 cm) 224 lbs.  BEHAVIORAL SYMPTOMS/MOOD NEUROLOGICAL BOWEL NUTRITION STATUS  Other (Comment) (n/a)  (n/a) Continent Diet (Please see discharge summary.)  AMBULATORY STATUS COMMUNICATION OF NEEDS Skin   Extensive Assist (Paraplegic, wheelchair bound) Verbally Normal                       Personal Care Assistance Level of Assistance  Bathing, Feeding, Dressing Bathing Assistance: Maximum assistance Feeding assistance: Independent Dressing Assistance: Maximum assistance     Functional Limitations Info   (n/a)          SPECIAL CARE FACTORS FREQUENCY                       Contractures      Additional Factors Info  Code Status, Allergies, Isolation Precautions Code Status Info: DNR Allergies Info: Penicillins, Senna, Sulfa Antibiotics     Isolation Precautions Info: MRSA         Discharge Medications:  Discharge Medications:    Medication List    STOP taking these medications       azithromycin 250 MG tablet  Commonly known as: ZITHROMAX Z-PAK     oxyCODONE-acetaminophen 5-325 MG tablet  Commonly known as: PERCOCET/ROXICET      TAKE these medications       acetaminophen 325 MG tablet  Commonly known as: TYLENOL  Take 650 mg by mouth 2 (two) times daily.     baclofen 10 MG tablet  Commonly known as: LIORESAL  Take 5 mg by mouth daily.     bisacodyl 5 MG EC tablet  Commonly known as: DULCOLAX  Take 5 mg by mouth daily.     Cranberry 250 MG Caps  Take 1 capsule by mouth 2 (two) times daily.     diazepam 5 MG tablet  Commonly known as: VALIUM  Take 5 mg by mouth 2 (two) times daily.     DULoxetine 30 MG capsule  Commonly known as: CYMBALTA  Take 30 mg by mouth 3 (three) times daily.     furosemide 40 MG tablet  Commonly known as: LASIX  Take 1 tablet (40 mg total) by mouth daily.     gabapentin 600 MG tablet  Commonly known as: NEURONTIN  Take 600 mg by mouth 3 (three) times daily.     levofloxacin 750 MG tablet  Commonly known as: LEVAQUIN  Take  1 tablet (750 mg total) by mouth daily.     loperamide 2 MG tablet  Commonly known as: IMODIUM A-D  Take 2 mg by mouth as needed for diarrhea or loose stools.     LORazepam 0.5 MG tablet  Commonly known as: ATIVAN  Take 0.5 mg by mouth daily as needed for anxiety (anxiety).     LYRICA 75 MG capsule  Generic drug: pregabalin  Take 75 mg by mouth 2 (two) times daily.     mirabegron ER 50 MG Tb24 tablet  Commonly known as: MYRBETRIQ  Take 50 mg by mouth daily.     mupirocin ointment 2 %  Commonly known as: BACTROBAN  Place 1 application into the nose 3 (three) times daily as needed (to supra pubic area to prevent infection).     ondansetron 4 MG disintegrating tablet   Commonly known as: ZOFRAN-ODT  Take 4 mg by mouth every 6 (six) hours as needed for nausea or vomiting.     phenol 1.4 % Liqd  Commonly known as: CHLORASEPTIC  Use as directed 1 spray in the mouth or throat as needed for throat irritation / pain.     POLYETHYLENE GLYCOL 3350 PO  Take 17 g/mL by mouth daily.     QUEtiapine 50 MG tablet  Commonly known as: SEROQUEL  Take 50-100 mg by mouth 2 (two) times daily. 50 mg in the am and 100 mg at bedtime.     ranitidine 150 MG tablet  Commonly known as: ZANTAC  Take 150 mg by mouth 2 (two) times daily.     sertraline 100 MG tablet  Commonly known as: ZOLOFT  Take 100 mg by mouth daily.     tamsulosin 0.4 MG Caps capsule  Commonly known as: FLOMAX  Take 0.4 mg by mouth at bedtime.     traMADol 50 MG tablet  Commonly known as: ULTRAM  Take 50 mg by mouth every 6 (six) hours as needed for moderate pain (pain).     warfarin 6 MG tablet  Commonly known as: COUMADIN  Take 6 mg by mouth daily.           Relevant Imaging Results:  Relevant Lab Results:   Additional Information SSN: [id number]  Rojelio BrennerVaughn, Jaquane Boughner S, LCSW [phone]

## 2015-02-22 NOTE — Clinical Social Work Note (Signed)
Clinical Social Work Assessment  Patient Details  Name: Daniel Wolfe MRN: [id number] Date of Birth: October 20, 1950  Date of referral:  02/22/15               Reason for consult:  Facility Placement, Discharge Planning (Patient admitted from facility.)                Permission sought to share information with:  Facility Medical sales representativeContact Representative Permission granted to share information::  Yes, Verbal Permission Granted  Name::        Agency::  [facility]  Relationship::     Contact Information:  [phone]  Housing/Transportation Living arrangements for the past 2 months:  Assisted Living Facility Source of Information:  Patient, Facility Patient Interpreter Needed:  None Criminal Activity/Legal Involvement Pertinent to Current Situation/Hospitalization:  No - Comment as needed Significant Relationships:  None Lives with:  Facility Resident Do you feel safe going back to the place where you live?  Yes Need for family participation in patient care:  No (Coment) (Patient able to make own decisions.)  Care giving concerns:  Patient expressed no concerns at this time.    Social Worker assessment / plan:  CSW received referral [facility]. CSW spoke with patient who [facility] and will return once medically stable. [facility], who stated patient able to return at time of discharge. CSW to continue to follow and assist with discharge planning needs.  Employment status:  Disabled (Comment on whether or not currently receiving Disability) Insurance information:  Managed Medicare [employer]) PT Recommendations:  Not assessed at this time Information / Referral to community resources:  Other (Comment Required) (Return to ALF)  Patient/Family's Response to care:  Patient understanding and agreeable to CSW plan of care.  Patient/Family's Understanding of and Emotional Response to  Diagnosis, Current Treatment, and Prognosis:  Patient understanding and agreeable to CSW plan of care.  Emotional Assessment Appearance:  Appears stated age Attitude/Demeanor/Rapport:  Other (Appropriate) Affect (typically observed):  Accepting, Appropriate Orientation:  Oriented to Self, Oriented to Place, Oriented to  Time, Oriented to Situation Alcohol / Substance use:  Not Applicable Psych involvement (Current and /or in the community):  No (Comment) (Not appropriate on this admission.)  Discharge Needs  Concerns to be addressed:  No discharge needs identified Readmission within the last 30 days:  No Current discharge risk:  None Barriers to Discharge:  No Barriers Identified   Rod MaeVaughn, Michiko Lineman S, LCSW 02/22/2015, 12:31 PM [phone]

## 2015-02-22 NOTE — Progress Notes (Signed)
ANTICOAGULATION CONSULT NOTE - Follow Up Consult  Pharmacy Consult for Coumadin (chronic) Indication: Protein S deficiency and history of DVT  Allergies  Allergen Reactions  . Penicillins Other (See Comments)    Unknown on MAR  . Senna Nausea And Vomiting  . Sulfa Antibiotics Other (See Comments)    [facility]   Patient Measurements: Height: 5\' 10"  (177.8 cm) Weight: 224 lb 13.9 oz (102 kg) IBW/kg (Calculated) : 73 Vital Signs: Temp: 98 F (36.7 C) (01/04 0601) Temp Source: Oral (01/04 0601) BP: 142/80 mmHg (01/04 0601) Pulse Rate: 84 (01/04 0601) Labs:  Recent Labs  02/20/15 0636 02/20/15 0710 02/20/15 1700 02/21/15 0540 02/22/15 0410  HGB 14.7  --   --  15.0  --   HCT 45.0  --   --  47.1  --   PLT 598*  --   --  346  --   APTT  --   --   --  57*  --   LABPROT  --   --  34.6* 37.4* 36.1*  INR  --   --  3.53* 3.92* 3.74*  CREATININE  --  1.18  --   --   --    Estimated Creatinine Clearance: 77.7 mL/min (by C-G formula based on Cr of 1.18).  Assessment: 65 year old male on chronic Coumadin for Protein S deficiency and history of a DVT admitted from assisted living facility for sore throat.   PTA dose was 6mg  daily.   INR on admission was elevated at 3.53 and remains elevated at 3.74 but trending down. Dose has been held. Last dose per assisted living center MAG was on 12/31.  Patient reports being unable to eat for 4 days contributing to INR.   Goal of Therapy:  INR 2-3 Monitor platelets by anticoagulation protocol: Yes   Plan:  No Coumadin again tonight.  Daily PT/INR.   Link SnufferJessica Janette Harvie, PharmD, BCPS Clinical Pharmacist [phone] 02/22/2015,10:02 AM

## 2015-02-22 NOTE — Care Management Obs Status (Signed)
MEDICARE OBSERVATION STATUS NOTIFICATION   Patient Details  Name: Daniel LenisRoy D Cuadrado MRN: [id number] Date of Birth: Jan 13, 1951   Medicare Observation Status Notification Given:  Yes    Darcel Smallingnna C Tyneka Scafidi, RN 02/22/2015, 9:18 PM

## 2015-02-22 NOTE — Discharge Summary (Signed)
Family Medicine Teaching [facility] Discharge Summary  [name] Medical record number: [id number] Date of birth: 13-May-1950 Age: 65 y.o. Gender: male Date of Admission: 02/20/2015  Date of Discharge: 02/23/14 Admitting Physician: Carney Living, MD  Primary Care Provider: MCDIARMID,TODD D, MD Consultants: None  Indication for Hospitalization: sore throat  Discharge Diagnoses/Problem List:  Patient Active Problem List   Diagnosis Date Noted  . HCAP (healthcare-associated pneumonia) 02/23/2015  . Oxygen desaturation   . Acute pulmonary edema (HCC)   . Pneumonia       . Acute pharyngitis 02/20/2015  . Sore throat 02/20/2015  . 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 SAH (subarachnoid hemorrhage) (HCC) 08/23/2013  . Paraplegia (HCC) 08/23/2013     Disposition: ALF  Discharge Condition: stable  Discharge Exam:  General: Fair appearing, no distress. Lesion on corner of right lip Cardiovascular: Regular rate and rhythm. Normal S1 and S2. No heart murmurs present. No extra heart sounds Respiratory: Normal work of breathing, some crackles hears at bilateral lung fields Abdomen: Soft, non-tender, non-distended. Suprapubic catheter in place and draining Extremities: no edema  Brief Hospital Course:  Daniel Wolfe is a 65 y.o. male who presented with sore throat and decreased PO intake. PMH is significant for Aneurysm, Clotting disorder, Depression, Vitamin D deficiency disease, Constipation, GERD, Neuropathy, Urinary retention, Anxiety, Spinal paraplegia, Neurogenic bladder, Benign prostate hyperplasia, and Brain aneurysm.  Acute Pharyngitis:  Patient initially presented to [facility] ED with complaints of sore throat and trouble swallowing. According to pt he had not eaten or drank anything 4 days prior to presentation. Low Centor criteria. Flexible laryngoscopy ruled out epiglottitis or airway distress. CCM was consulted and noted pt most likely had acute  pharyngitis. Throat culture taken. IV Clindamycin was given x 1 dose and IV Ceftriaxone was initiated on 1/02. Vital signs were stable and patient was afebrile throughout hospitalization. IVF were initiated for poor PO intake. Rapid strep test negative, throat culture negative. Ceftriaxone discontinued on 1/03 due to likely viral etiology. Prior to discharge, patient's sore throat improved as well as PO intake.   Possible Pneumonia vs Pulmonary edema with oxygen requirement: Required 2 L [place] intermittently, when tried to remove patient would desaturate to 80s. Patient does not have any known pulmonary diseases or pulmonary infections. CXR on 1/5 showed bibasilar iniltrates. Pt also had bilateral crackles on lung exam. Oxygen was continued and was weaned to 1 L [place]. Levaquin was started on 1/5 for a 5 day course. IV Lasix 60 mg given on 1/5. Furosemide 40 mg BID started on 1/5, will continue for 7 day course.   Leukocytosis:  WBC initially 9.4 but then peaked at 19.8 day 2 of admission. Likely secondary to pharyngitis or pneumonia. Patient remained afebrile with stable vital signs. Prior to discharge, WBC was 16.4.   Emesis: Pt had increased nausea and emesis after initiation of antibiotics. Emesis initially described as bilious. Did not resolve with Zofran but later resolved with IV Phenergan. Prior to discharge, patient's emesis resolved and nausea improved.   Protein S deficiency with increased INR:  Continued home coumadin per pharmacy. INR checked daily.   All other chronic medical conditions stable throughout admission and managed with home regimens.   Issues for Follow Up:  1. PO intake: ensure patient is adequately hydrated. PO intake low during hospitalization due to sore throat. He also reports that he only drinks [facility] and not water. 2. Ensure emesis is improving 3. Will need  repeat CBC on follow up for leukocytosis 4. Lasix 40 mg BID will be taken until 1/12 to complete 7 day  course 5. Levaquin will be taken until 1/9 to complete 5 day course  Significant Procedures: None  Significant Labs and Imaging:   Recent Labs Lab 02/20/15 0636 02/21/15 0540  WBC 19.2* 19.8*  HGB 14.7 15.0  HCT 45.0 47.1  PLT 598* 346    Recent Labs Lab 02/20/15 0710  NA 138  K 4.2  CL 105  CO2 24  GLUCOSE 105*  BUN 13  CREATININE 1.18  CALCIUM 9.0   Dg Chest Port 1 View  02/23/2015  CLINICAL DATA:  Hypoxia EXAM: PORTABLE CHEST - 1 VIEW COMPARISON:  02/19/2015 FINDINGS: Cardiac shadow is mildly enlarged but stable. Elevation the right hemidiaphragm is again seen. Bibasilar infiltrates are noted better visualized due to the improved inspiratory effort. No bony abnormality is noted. IMPRESSION: Persistent bibasilar infiltrates. Electronically Signed   By: Alcide Clever M.D.   On: 02/23/2015 12:01     Results/Tests Pending at Time of Discharge: none  Discharge Medications:    Medication List    STOP taking these medications        azithromycin 250 MG tablet  Commonly known as:  ZITHROMAX Z-PAK     oxyCODONE-acetaminophen 5-325 MG tablet  Commonly known as:  PERCOCET/ROXICET      TAKE these medications        acetaminophen 325 MG tablet  Commonly known as:  TYLENOL  Take 650 mg by mouth 2 (two) times daily.     baclofen 10 MG tablet  Commonly known as:  LIORESAL  Take 5 mg by mouth daily.     bisacodyl 5 MG EC tablet  Commonly known as:  DULCOLAX  Take 5 mg by mouth daily.     Cranberry 250 MG Caps  Take 1 capsule by mouth 2 (two) times daily.     diazepam 5 MG tablet  Commonly known as:  VALIUM  Take 5 mg by mouth 2 (two) times daily.     DULoxetine 30 MG capsule  Commonly known as:  CYMBALTA  Take 30 mg by mouth 3 (three) times daily.     furosemide 40 MG tablet  Commonly known as:  LASIX  Take 1 tablet (40 mg total) by mouth daily.     gabapentin 600 MG tablet  Commonly known as:  NEURONTIN  Take 600 mg by mouth 3 (three) times daily.      levofloxacin 750 MG tablet  Commonly known as:  LEVAQUIN  Take 1 tablet (750 mg total) by mouth daily.     loperamide 2 MG tablet  Commonly known as:  IMODIUM A-D  Take 2 mg by mouth as needed for diarrhea or loose stools.     LORazepam 0.5 MG tablet  Commonly known as:  ATIVAN  Take 0.5 mg by mouth daily as needed for anxiety (anxiety).     LYRICA 75 MG capsule  Generic drug:  pregabalin  Take 75 mg by mouth 2 (two) times daily.     mirabegron ER 50 MG Tb24 tablet  Commonly known as:  MYRBETRIQ  Take 50 mg by mouth daily.     mupirocin ointment 2 %  Commonly known as:  BACTROBAN  Place 1 application into the nose 3 (three) times daily as needed (to supra pubic area to prevent infection).     ondansetron 4 MG disintegrating tablet  Commonly known as:  ZOFRAN-ODT  Take  4 mg by mouth every 6 (six) hours as needed for nausea or vomiting.     phenol 1.4 % Liqd  Commonly known as:  CHLORASEPTIC  Use as directed 1 spray in the mouth or throat as needed for throat irritation / pain.     POLYETHYLENE GLYCOL 3350 PO  Take 17 g/mL by mouth daily.     QUEtiapine 50 MG tablet  Commonly known as:  SEROQUEL  Take 50-100 mg by mouth 2 (two) times daily. 50 mg in the am and 100 mg at bedtime.     ranitidine 150 MG tablet  Commonly known as:  ZANTAC  Take 150 mg by mouth 2 (two) times daily.     sertraline 100 MG tablet  Commonly known as:  ZOLOFT  Take 100 mg by mouth daily.     tamsulosin 0.4 MG Caps capsule  Commonly known as:  FLOMAX  Take 0.4 mg by mouth at bedtime.     traMADol 50 MG tablet  Commonly known as:  ULTRAM  Take 50 mg by mouth every 6 (six) hours as needed for moderate pain (pain).     warfarin 6 MG tablet  Commonly known as:  COUMADIN  Take 6 mg by mouth daily.        Discharge Instructions: Please refer to Patient Instructions section of EMR for full details.  Patient was counseled important signs and symptoms that should prompt return to medical  care, changes in medications, dietary instructions, activity restrictions, and follow up appointments.   Follow-Up Appointments:   Follow-up Information    Follow up with Jeremy Schmitz, MD. Go on 03/01/2015.   Specialty:  Family Medicine   Why:  hospital follow up at 2:45 Clare GandyPM   Contact information:   [address] [place] [address] [phone]        Beaulah Dinninghristina M Anecia Nusbaum, MD 02/22/2015, 1:38 AM PGY-1, [facility]

## 2015-02-23 ENCOUNTER — Observation Stay (HOSPITAL_COMMUNITY): Payer: Commercial Managed Care - HMO

## 2015-02-23 DIAGNOSIS — R221 Localized swelling, mass and lump, neck: Secondary | ICD-10-CM | POA: Diagnosis present

## 2015-02-23 DIAGNOSIS — Z66 Do not resuscitate: Secondary | ICD-10-CM | POA: Diagnosis present

## 2015-02-23 DIAGNOSIS — G629 Polyneuropathy, unspecified: Secondary | ICD-10-CM | POA: Diagnosis present

## 2015-02-23 DIAGNOSIS — N319 Neuromuscular dysfunction of bladder, unspecified: Secondary | ICD-10-CM | POA: Diagnosis present

## 2015-02-23 DIAGNOSIS — J810 Acute pulmonary edema: Secondary | ICD-10-CM | POA: Diagnosis not present

## 2015-02-23 DIAGNOSIS — G8220 Paraplegia, unspecified: Secondary | ICD-10-CM | POA: Diagnosis present

## 2015-02-23 DIAGNOSIS — D6859 Other primary thrombophilia: Secondary | ICD-10-CM | POA: Diagnosis present

## 2015-02-23 DIAGNOSIS — R0902 Hypoxemia: Secondary | ICD-10-CM | POA: Diagnosis not present

## 2015-02-23 DIAGNOSIS — Z883 Allergy status to other anti-infective agents status: Secondary | ICD-10-CM | POA: Diagnosis not present

## 2015-02-23 DIAGNOSIS — Z888 Allergy status to other drugs, medicaments and biological substances status: Secondary | ICD-10-CM | POA: Diagnosis not present

## 2015-02-23 DIAGNOSIS — J029 Acute pharyngitis, unspecified: Secondary | ICD-10-CM | POA: Diagnosis not present

## 2015-02-23 DIAGNOSIS — R112 Nausea with vomiting, unspecified: Secondary | ICD-10-CM | POA: Diagnosis not present

## 2015-02-23 DIAGNOSIS — J189 Pneumonia, unspecified organism: Secondary | ICD-10-CM | POA: Diagnosis present

## 2015-02-23 DIAGNOSIS — Z7901 Long term (current) use of anticoagulants: Secondary | ICD-10-CM | POA: Diagnosis not present

## 2015-02-23 DIAGNOSIS — Y95 Nosocomial condition: Secondary | ICD-10-CM | POA: Diagnosis present

## 2015-02-23 DIAGNOSIS — F329 Major depressive disorder, single episode, unspecified: Secondary | ICD-10-CM | POA: Diagnosis present

## 2015-02-23 DIAGNOSIS — J020 Streptococcal pharyngitis: Secondary | ICD-10-CM | POA: Diagnosis present

## 2015-02-23 DIAGNOSIS — Z880 Allergy status to penicillin: Secondary | ICD-10-CM | POA: Diagnosis not present

## 2015-02-23 DIAGNOSIS — K219 Gastro-esophageal reflux disease without esophagitis: Secondary | ICD-10-CM | POA: Diagnosis present

## 2015-02-23 DIAGNOSIS — I671 Cerebral aneurysm, nonruptured: Secondary | ICD-10-CM | POA: Diagnosis present

## 2015-02-23 DIAGNOSIS — N401 Enlarged prostate with lower urinary tract symptoms: Secondary | ICD-10-CM | POA: Diagnosis present

## 2015-02-23 DIAGNOSIS — F419 Anxiety disorder, unspecified: Secondary | ICD-10-CM | POA: Diagnosis present

## 2015-02-23 DIAGNOSIS — Z993 Dependence on wheelchair: Secondary | ICD-10-CM | POA: Diagnosis not present

## 2015-02-23 DIAGNOSIS — Z79899 Other long term (current) drug therapy: Secondary | ICD-10-CM | POA: Diagnosis not present

## 2015-02-23 DIAGNOSIS — R338 Other retention of urine: Secondary | ICD-10-CM | POA: Diagnosis present

## 2015-02-23 LAB — CBC WITH DIFFERENTIAL/PLATELET
Basophils Absolute: 0 10*3/uL (ref 0.0–0.1)
Basophils Relative: 0 %
EOS ABS: 0.2 10*3/uL (ref 0.0–0.7)
EOS PCT: 1 %
HCT: 40.6 % (ref 39.0–52.0)
Hemoglobin: 12.8 g/dL — ABNORMAL LOW (ref 13.0–17.0)
LYMPHS ABS: 2.8 10*3/uL (ref 0.7–4.0)
Lymphocytes Relative: 21 %
MCH: 30.2 pg (ref 26.0–34.0)
MCHC: 31.5 g/dL (ref 30.0–36.0)
MCV: 95.8 fL (ref 78.0–100.0)
MONOS PCT: 15 %
Monocytes Absolute: 2.1 10*3/uL — ABNORMAL HIGH (ref 0.1–1.0)
Neutro Abs: 8.5 10*3/uL — ABNORMAL HIGH (ref 1.7–7.7)
Neutrophils Relative %: 63 %
PLATELETS: 357 10*3/uL (ref 150–400)
RBC: 4.24 MIL/uL (ref 4.22–5.81)
RDW: 18.1 % — AB (ref 11.5–15.5)
WBC: 13.5 10*3/uL — AB (ref 4.0–10.5)

## 2015-02-23 LAB — PROTIME-INR
INR: 3.29 — ABNORMAL HIGH (ref 0.00–1.49)
Prothrombin Time: 32.8 seconds — ABNORMAL HIGH (ref 11.6–15.2)

## 2015-02-23 LAB — CULTURE, GROUP A STREP: STREP A CULTURE: NEGATIVE

## 2015-02-23 MED ORDER — FUROSEMIDE 10 MG/ML IJ SOLN
60.0000 mg | Freq: Once | INTRAMUSCULAR | Status: AC
  Administered 2015-02-23: 60 mg via INTRAVENOUS
  Filled 2015-02-23: qty 6

## 2015-02-23 MED ORDER — LEVOFLOXACIN 750 MG PO TABS
750.0000 mg | ORAL_TABLET | Freq: Every day | ORAL | Status: DC
  Administered 2015-02-23 – 2015-02-24 (×2): 750 mg via ORAL
  Filled 2015-02-23 (×2): qty 1

## 2015-02-23 MED ORDER — FUROSEMIDE 10 MG/ML IJ SOLN
40.0000 mg | Freq: Two times a day (BID) | INTRAMUSCULAR | Status: DC
  Filled 2015-02-23: qty 4

## 2015-02-23 NOTE — Progress Notes (Signed)
ANTICOAGULATION CONSULT NOTE - Follow Up Consult  Pharmacy Consult for Coumadin (chronic) Indication: Protein S deficiency and history of DVT  Allergies  Allergen Reactions  . Penicillins Other (See Comments)    Unknown on MAR  . Senna Nausea And Vomiting  . Sulfa Antibiotics Other (See Comments)    Unknown on [facility]   Patient Measurements: Height: 5\' 10"  (177.8 cm) Weight: 224 lb 13.9 oz (102 kg) IBW/kg (Calculated) : 73 Vital Signs: Temp: 98.8 F (37.1 C) (01/05 1209) Temp Source: Oral (01/05 1209) BP: 167/95 mmHg (01/05 1209) Pulse Rate: 84 (01/05 1209) Labs:  Recent Labs  02/21/15 0540 02/22/15 0410 02/22/15 1037 02/22/15 1430 02/23/15 0558  HGB 15.0  --  14.6  --  12.8*  HCT 47.1  --  45.3  --  40.6  PLT 346  --  395  --  357  APTT 57*  --   --   --   --   LABPROT 37.4* 36.1*  --   --  32.8*  INR 3.92* 3.74*  --   --  3.29*  CREATININE  --   --   --  0.81  --    Estimated Creatinine Clearance: 113.1 mL/min (by C-G formula based on Cr of 0.81).  Assessment: 65 year old male on chronic Coumadin for Protein S deficiency and history of a DVT admitted from assisted living facility for sore throat.   PTA dose was 6mg  daily.   INR on admission was elevated at 3.53 and remains elevated at 3.29 but trending down. Dose has been held x4 days now. Last dose per assisted living center MAG was on 12/31.  Patient reports being unable to eat for 4 days contributing to INR.   Goal of Therapy:  INR 2-3 Monitor platelets by anticoagulation protocol: Yes   Plan:  No Coumadin again tonight.  Daily PT/INR.   Link SnufferJessica Angell Pincock, PharmD, BCPS Clinical Pharmacist [phone] 02/23/2015,1:30 PM

## 2015-02-23 NOTE — Progress Notes (Signed)
Family Medicine Teaching Service Daily Progress Note Intern Pager: [phone]  [name] Medical record number: [id number] Date of birth: [birth year] Age: 65 y.o. Gender: male  Primary Care Provider: MCDIARMID,TODD D, MD Consultants: None Code Status: Full code  Pt Overview and Major Events to Date:  1/2: patient admitted 1/3: abx discontinued 1/4: O2 requirement 2 L [place]  Assessment and Plan: Daniel Wolfe is a 65 y.o. male presenting with sore throat . PMH is significant for Aneurysm, Clotting disorder, Depression, Vitamin D deficiency disease, Constipation, GERD, Neuropathy, Urinary retention, Anxiety, Spinal paraplegia, Neurogenic bladder, Benign prostate hyperplasia, and Brain aneurysm.  Acute Pharyngitis with difficulty swallowing: Rapid strep negative. Low centor criteria score. Pharyngitis likely viral at this point. antibiotic therapy was discontinued on 1/02. Sore throat improving. F/u throat culture--> strep A neg - IV phenergan prn for nausea; nausea has resolved. No PRN phenergan needed sine 0300 yesterday - Stop maintenance IVF due to increased PO intake - Chloraseptic PRN for throat pain relief  Leukocytosis: WBC 9.4>>19.2>>19.8>>17>>13.5. Resolving. Likely secondary to pharyngitis. Patient has been afebrile with stable vital signs.  - Resolving, no longer need AM CBCs   Oxygen Requirement: Has required 2 L [place], when tried to remove patient would desaturate to 80s. Patient does not have any known pulmonary diseases or pulmonary infections. Has been on mIVF, concerns for possible pulmonary edema.  - STAT CXR  Protein S deficiency with elevated INR: Problem not in Epic but per patient history. Has had DVT in past. Is currently on Coumadin for anticoagulation. Coumadin has been held since hospitalization due to supra therapeutic levels. Last dose per ALF was on 12/31. INR decreasing. - Continue coumadin per pharmacy - daily INR--> has been coming down, still holding  coumadin - CMP and Lipase to evaluate hepatic function--> AST and ALT mildly low  Urinary Retention with Suprapubic Cath: Neurogenic bladder due to paraplegia. Last changed December 15th, not due for a new one.  - Continue to ensure cath site is clean and has good urine output; Has had 1175 cc output in last 24 hrs  Neuropathy associated with Paraplegia: has been paraplegic since his subarachnoid hemorrhage 8 years ago. Does not have complete loss of his movement and strength in his lower extremities but unable to walk and has to use a wheelchair. - Continue home Gabapentin 600 mg TID  FEN/GI: SLIV, heart healthy diet Prophylaxis: Coumadin (currenlty being held)  Disposition: ALF possibly today  Subjective:  - Patient reports no emesis overnight or this morning - Resolved nausea, last PRN phenergan 0300 yesterday - Ate some dinner last night also had water and soda today - Sore throat improved with Chloraseptic - No chest pain or dyspnea.  - Denies abdominal pain at this time  Objective: Temp:  [98.4 F (36.9 C)-99.6 F (37.6 C)] 98.4 F (36.9 C) (01/05 0521) Pulse Rate:  [81-88] 81 (01/05 0521) Resp:  [18-20] 19 (01/05 0521) BP: (141-145)/(85-92) 145/92 mmHg (01/05 0521) SpO2:  [90 %-94 %] 94 % (01/05 0521)   Physical Exam: General: Fair appearing, no distress. Lesion on corner of right lip Cardiovascular: Regular rate and rhythm. Normal S1 and S2. No heart murmurs present. No extra heart sounds Respiratory: Clear to auscultation, however, diminished throughout Abdomen: Soft, non-tender, non-distended. Suprapubic catheter in place and draining Extremities: no edema  Laboratory:  Recent Labs Lab 02/21/15 0540 02/22/15 1037 02/23/15 0558  WBC 19.8* 17.0* 13.5*  HGB 15.0 14.6 12.8*  HCT 47.1 45.3 40.6  PLT  346 395 357    Recent Labs Lab 02/20/15 0710 02/22/15 1430  NA 138 143  K 4.2 4.1  CL 105 111  CO2 24 22  BUN 13 12  CREATININE 1.18 0.81  CALCIUM  9.0 8.7*  PROT  --  6.7  BILITOT  --  0.8  ALKPHOS  --  65  ALT  --  9*  AST  --  13*  GLUCOSE 105* 88    Imaging/Diagnostic Tests: No results found.  Beaulah Dinninghristina M Gambino, MD 02/23/2015, 9:39 AM PGY-1, [facility] FPTS Intern pager: [phone], text pages welcome

## 2015-02-24 LAB — CBC
HCT: 47.1 % (ref 39.0–52.0)
Hemoglobin: 15.5 g/dL (ref 13.0–17.0)
MCH: 30.9 pg (ref 26.0–34.0)
MCHC: 32.9 g/dL (ref 30.0–36.0)
MCV: 93.8 fL (ref 78.0–100.0)
PLATELETS: 405 10*3/uL — AB (ref 150–400)
RBC: 5.02 MIL/uL (ref 4.22–5.81)
RDW: 17.1 % — AB (ref 11.5–15.5)
WBC: 16.4 10*3/uL — AB (ref 4.0–10.5)

## 2015-02-24 LAB — PROTIME-INR
INR: 2.54 — AB (ref 0.00–1.49)
PROTHROMBIN TIME: 27 s — AB (ref 11.6–15.2)

## 2015-02-24 MED ORDER — FUROSEMIDE 40 MG PO TABS: 40.0000 mg | ORAL_TABLET | Freq: Every day | ORAL | Status: DC

## 2015-02-24 MED ORDER — FUROSEMIDE 40 MG PO TABS
40.0000 mg | ORAL_TABLET | Freq: Every day | ORAL | Status: DC
  Administered 2015-02-24: 40 mg via ORAL
  Filled 2015-02-24: qty 1

## 2015-02-24 MED ORDER — WARFARIN SODIUM 3 MG PO TABS
3.0000 mg | ORAL_TABLET | Freq: Once | ORAL | Status: AC
  Administered 2015-02-24: 3 mg via ORAL
  Filled 2015-02-24: qty 1

## 2015-02-24 MED ORDER — LEVOFLOXACIN 750 MG PO TABS: 750.0000 mg | ORAL_TABLET | Freq: Every day | ORAL | Status: DC

## 2015-02-24 MED ORDER — PHENOL 1.4 % MT LIQD: 1.0000 | OROMUCOSAL | Status: DC | PRN

## 2015-02-24 NOTE — Clinical Social Work Note (Signed)
Patient to be discharged back to [facility].  Facility: [facility] RN report number: [id number] Transportation: Elon SpannerTAR  Jalaysia Lobb, [place] [phone] Orthopedics: [id number] Surgical: [phone]

## 2015-02-24 NOTE — Care Management Note (Signed)
Case Management Note  Patient Details  Name: Daniel Wolfe MRN: [id number] Date of Birth: Sep 23, 1950  Subjective/Objective:                    Action/Plan:   Expected Discharge Date:                  Expected Discharge Plan:  Assisted Living / Rest Home  In-House Referral:  Clinical Social Work  Discharge planning Services  CM Consult  Post Acute Care Choice:  Durable Medical Equipment Choice offered to:     DME Arranged:  Oxygen DME Agency:  [facility]  HH Arranged:    HH Agency:     Status of Service:  Completed, signed off  Medicare Important Message Given:    Date Medicare IM Given:    Medicare IM give by:    Date Additional Medicare IM Given:    Additional Medicare Important Message give by:     If discussed at Long Length of Stay Meetings, dates discussed:    Additional Comments:  Kingsley PlanWile, Kinney Sackmann Marie, RN 02/24/2015, 2:44 PM

## 2015-02-24 NOTE — Progress Notes (Addendum)
SATURATION QUALIFICATIONS: (This note is used to comply with regulatory documentation for home oxygen)  Patient Saturations on Room Air at Rest = 87%  Patient Saturations on Room Air while Ambulating = % - N/A pt can't ambulate  Patient Saturations on 1L Liters of oxygen while Ambulating = 97%  Please briefly explain why patient needs home oxygen:  While at rest pt's oxygen level at 87% on RA  and on 1L at rest pt's O2 level at 97%.

## 2015-02-24 NOTE — Care Management (Addendum)
Patient refusing home oxygen . Pager family medicine pager . [name] RN BSN [phone]    Patient [name] and is now agreeable to home oxygen . [name] RN BSN

## 2015-02-24 NOTE — Progress Notes (Signed)
ANTICOAGULATION CONSULT NOTE - Follow Up Consult  Pharmacy Consult:  Coumadin Indication: Protein S deficiency and history of DVT  Allergies  Allergen Reactions  . Penicillins Other (See Comments)    Unknown on MAR  . Senna Nausea And Vomiting  . Sulfa Antibiotics Other (See Comments)    [facility]   Patient Measurements: Height: 5\' 10"  (177.8 cm) Weight: 224 lb 13.9 oz (102 kg) IBW/kg (Calculated) : 73   Vital Signs: Temp: 99.1 F (37.3 C) (01/06 1319) Temp Source: Oral (01/06 1319) BP: 151/93 mmHg (01/06 1319) Pulse Rate: 100 (01/06 1319) Labs:  Recent Labs  02/22/15 0410  02/22/15 1037 02/22/15 1430 02/23/15 0558 02/24/15 0739  HGB  --   < > 14.6  --  12.8* 15.5  HCT  --   --  45.3  --  40.6 47.1  PLT  --   --  395  --  357 405*  LABPROT 36.1*  --   --   --  32.8* 27.0*  INR 3.74*  --   --   --  3.29* 2.54*  CREATININE  --   --   --  0.81  --   --   < > = values in this interval not displayed. Estimated Creatinine Clearance: 113.1 mL/min (by C-G formula based on Cr of 0.81).    Assessment: 64 YOM on chronic Coumadin for history of protein S deficiency and history of DVT.  Patient's INR was supra-therapeutic on admit and Coumadin has been on hold since.  INR now decreased to therapeutic level; no bleeding reported.  Patient's intake has improved.  Noted he is also on Levaquin, which could increase the effect of Coumadin.   Goal of Therapy:  INR 2-3    Plan:  - Coumadin 3mg  PO today - Daily PT / INR - F/U resume home meds    Micheale Schlack D. Laney Potashang, PharmD, BCPS Pager:  [phone] 02/24/2015, 2:33 PM

## 2015-02-24 NOTE — Discharge Instructions (Signed)
You were admitted to the hospital for sore throat and trouble swallowing. While you were here it was discovered that you have a pneumonia and possibly some extra fluid in your lungs. We started you on antibiotics and a diuretic pill. The antibiotic (Levaquin) you will take until 1/9 and the diuretic (Lasix) you will take until 1/12. Those medications have been sent to your pharmacy. Please attend your hospital follow up appointment below.   Follow-up Information    Follow up with Clare GandyJeremy Schmitz, MD. Go on 03/01/2015.   Specialty:  Family Medicine   Why:  hospital follow up at 2:45 PM   Contact information:   [address] [place] [address] [phone]      It was a pleasure caring for you, hope you feel better soon!

## 2015-02-25 ENCOUNTER — Encounter (HOSPITAL_COMMUNITY): Payer: Self-pay

## 2015-02-25 ENCOUNTER — Emergency Department (HOSPITAL_COMMUNITY): Payer: Commercial Managed Care - HMO

## 2015-02-25 ENCOUNTER — Inpatient Hospital Stay (HOSPITAL_COMMUNITY)
Admission: EM | Admit: 2015-02-25 | Discharge: 2015-03-01 | DRG: 871 | Disposition: A | Discharge status: Home Health | Payer: Commercial Managed Care - HMO | Attending: Family Medicine | Admitting: Family Medicine

## 2015-02-25 DIAGNOSIS — R0600 Dyspnea, unspecified: Secondary | ICD-10-CM | POA: Diagnosis present

## 2015-02-25 DIAGNOSIS — Z880 Allergy status to penicillin: Secondary | ICD-10-CM | POA: Diagnosis not present

## 2015-02-25 DIAGNOSIS — G8220 Paraplegia, unspecified: Secondary | ICD-10-CM | POA: Diagnosis present

## 2015-02-25 DIAGNOSIS — J189 Pneumonia, unspecified organism: Secondary | ICD-10-CM | POA: Diagnosis present

## 2015-02-25 DIAGNOSIS — Z7901 Long term (current) use of anticoagulants: Secondary | ICD-10-CM

## 2015-02-25 DIAGNOSIS — R9431 Abnormal electrocardiogram [ECG] [EKG]: Secondary | ICD-10-CM | POA: Diagnosis not present

## 2015-02-25 DIAGNOSIS — Z79899 Other long term (current) drug therapy: Secondary | ICD-10-CM

## 2015-02-25 DIAGNOSIS — G629 Polyneuropathy, unspecified: Secondary | ICD-10-CM | POA: Diagnosis present

## 2015-02-25 DIAGNOSIS — F411 Generalized anxiety disorder: Secondary | ICD-10-CM | POA: Diagnosis present

## 2015-02-25 DIAGNOSIS — D689 Coagulation defect, unspecified: Secondary | ICD-10-CM | POA: Diagnosis not present

## 2015-02-25 DIAGNOSIS — R0603 Acute respiratory distress: Secondary | ICD-10-CM

## 2015-02-25 DIAGNOSIS — D6859 Other primary thrombophilia: Secondary | ICD-10-CM | POA: Diagnosis present

## 2015-02-25 DIAGNOSIS — R338 Other retention of urine: Secondary | ICD-10-CM | POA: Diagnosis present

## 2015-02-25 DIAGNOSIS — I4581 Long QT syndrome: Secondary | ICD-10-CM | POA: Diagnosis present

## 2015-02-25 DIAGNOSIS — N401 Enlarged prostate with lower urinary tract symptoms: Secondary | ICD-10-CM | POA: Diagnosis present

## 2015-02-25 DIAGNOSIS — I609 Nontraumatic subarachnoid hemorrhage, unspecified: Secondary | ICD-10-CM

## 2015-02-25 DIAGNOSIS — K219 Gastro-esophageal reflux disease without esophagitis: Secondary | ICD-10-CM | POA: Diagnosis present

## 2015-02-25 DIAGNOSIS — I959 Hypotension, unspecified: Secondary | ICD-10-CM | POA: Diagnosis present

## 2015-02-25 DIAGNOSIS — J9692 Respiratory failure, unspecified with hypercapnia: Secondary | ICD-10-CM | POA: Diagnosis present

## 2015-02-25 DIAGNOSIS — E876 Hypokalemia: Secondary | ICD-10-CM | POA: Diagnosis not present

## 2015-02-25 DIAGNOSIS — Z888 Allergy status to other drugs, medicaments and biological substances status: Secondary | ICD-10-CM

## 2015-02-25 DIAGNOSIS — A419 Sepsis, unspecified organism: Principal | ICD-10-CM | POA: Diagnosis present

## 2015-02-25 DIAGNOSIS — R0902 Hypoxemia: Secondary | ICD-10-CM | POA: Diagnosis present

## 2015-02-25 DIAGNOSIS — Z8679 Personal history of other diseases of the circulatory system: Secondary | ICD-10-CM | POA: Diagnosis not present

## 2015-02-25 DIAGNOSIS — J810 Acute pulmonary edema: Secondary | ICD-10-CM | POA: Diagnosis not present

## 2015-02-25 DIAGNOSIS — Z66 Do not resuscitate: Secondary | ICD-10-CM | POA: Diagnosis present

## 2015-02-25 DIAGNOSIS — J811 Chronic pulmonary edema: Secondary | ICD-10-CM | POA: Diagnosis present

## 2015-02-25 DIAGNOSIS — J440 Chronic obstructive pulmonary disease with acute lower respiratory infection: Secondary | ICD-10-CM | POA: Diagnosis present

## 2015-02-25 DIAGNOSIS — N319 Neuromuscular dysfunction of bladder, unspecified: Secondary | ICD-10-CM | POA: Diagnosis present

## 2015-02-25 DIAGNOSIS — Z882 Allergy status to sulfonamides status: Secondary | ICD-10-CM

## 2015-02-25 DIAGNOSIS — Z9889 Other specified postprocedural states: Secondary | ICD-10-CM

## 2015-02-25 DIAGNOSIS — N400 Enlarged prostate without lower urinary tract symptoms: Secondary | ICD-10-CM | POA: Diagnosis present

## 2015-02-25 HISTORY — DX: Nontraumatic subarachnoid hemorrhage, unspecified: I60.9

## 2015-02-25 LAB — CBC WITH DIFFERENTIAL/PLATELET
BASOS ABS: 0 10*3/uL (ref 0.0–0.1)
Basophils Relative: 0 %
EOS ABS: 0.2 10*3/uL (ref 0.0–0.7)
EOS PCT: 1 %
HCT: 50.1 % (ref 39.0–52.0)
Hemoglobin: 16.3 g/dL (ref 13.0–17.0)
LYMPHS PCT: 12 %
Lymphs Abs: 2.2 10*3/uL (ref 0.7–4.0)
MCH: 30.6 pg (ref 26.0–34.0)
MCHC: 32.5 g/dL (ref 30.0–36.0)
MCV: 94.2 fL (ref 78.0–100.0)
MONO ABS: 1.3 10*3/uL — AB (ref 0.1–1.0)
MONOS PCT: 7 %
NEUTROS PCT: 80 %
Neutro Abs: 14.8 10*3/uL — ABNORMAL HIGH (ref 1.7–7.7)
Platelets: 412 10*3/uL — ABNORMAL HIGH (ref 150–400)
RBC: 5.32 MIL/uL (ref 4.22–5.81)
RDW: 17 % — AB (ref 11.5–15.5)
WBC: 18.5 10*3/uL — AB (ref 4.0–10.5)

## 2015-02-25 LAB — URINE MICROSCOPIC-ADD ON: RBC / HPF: NONE SEEN RBC/hpf (ref 0–5)

## 2015-02-25 LAB — URINALYSIS, ROUTINE W REFLEX MICROSCOPIC
BILIRUBIN URINE: NEGATIVE
Glucose, UA: NEGATIVE mg/dL
KETONES UR: 15 mg/dL — AB
Nitrite: POSITIVE — AB
PH: 7 (ref 5.0–8.0)
Protein, ur: 30 mg/dL — AB
SPECIFIC GRAVITY, URINE: 1.014 (ref 1.005–1.030)

## 2015-02-25 LAB — TROPONIN I: Troponin I: 0.09 ng/mL — ABNORMAL HIGH (ref <0.031)

## 2015-02-25 LAB — I-STAT CHEM 8, ED
BUN: 16 mg/dL (ref 6–20)
CALCIUM ION: 1.08 mmol/L — AB (ref 1.13–1.30)
Chloride: 96 mmol/L — ABNORMAL LOW (ref 101–111)
Creatinine, Ser: 1.2 mg/dL (ref 0.61–1.24)
GLUCOSE: 106 mg/dL — AB (ref 65–99)
HEMATOCRIT: 55 % — AB (ref 39.0–52.0)
Hemoglobin: 18.7 g/dL — ABNORMAL HIGH (ref 13.0–17.0)
Potassium: 2.9 mmol/L — ABNORMAL LOW (ref 3.5–5.1)
SODIUM: 140 mmol/L (ref 135–145)
TCO2: 31 mmol/L (ref 0–100)

## 2015-02-25 LAB — COMPREHENSIVE METABOLIC PANEL
ALK PHOS: 76 U/L (ref 38–126)
ALT: 14 U/L — AB (ref 17–63)
AST: 23 U/L (ref 15–41)
Albumin: 3.2 g/dL — ABNORMAL LOW (ref 3.5–5.0)
Anion gap: 16 — ABNORMAL HIGH (ref 5–15)
BILIRUBIN TOTAL: 1.3 mg/dL — AB (ref 0.3–1.2)
BUN: 14 mg/dL (ref 6–20)
CALCIUM: 9.1 mg/dL (ref 8.9–10.3)
CO2: 28 mmol/L (ref 22–32)
CREATININE: 1.32 mg/dL — AB (ref 0.61–1.24)
Chloride: 97 mmol/L — ABNORMAL LOW (ref 101–111)
GFR calc non Af Amer: 56 mL/min — ABNORMAL LOW (ref >60)
Glucose, Bld: 108 mg/dL — ABNORMAL HIGH (ref 65–99)
Potassium: 3.1 mmol/L — ABNORMAL LOW (ref 3.5–5.1)
SODIUM: 141 mmol/L (ref 135–145)
TOTAL PROTEIN: 7.2 g/dL (ref 6.5–8.1)

## 2015-02-25 LAB — I-STAT VENOUS BLOOD GAS, ED
Acid-Base Excess: 2 mmol/L (ref 0.0–2.0)
Bicarbonate: 29.8 mEq/L — ABNORMAL HIGH (ref 20.0–24.0)
O2 SAT: 20 %
PCO2 VEN: 57 mmHg — AB (ref 45.0–50.0)
PO2 VEN: 17 mmHg — AB (ref 30.0–45.0)
TCO2: 32 mmol/L (ref 0–100)
pH, Ven: 7.327 — ABNORMAL HIGH (ref 7.250–7.300)

## 2015-02-25 LAB — PROTIME-INR
INR: 2.3 — ABNORMAL HIGH (ref 0.00–1.49)
Prothrombin Time: 25 seconds — ABNORMAL HIGH (ref 11.6–15.2)

## 2015-02-25 LAB — CULTURE, BLOOD (ROUTINE X 2)
CULTURE: NO GROWTH
Culture: NO GROWTH

## 2015-02-25 LAB — LACTIC ACID, PLASMA
LACTIC ACID, VENOUS: 1.5 mmol/L (ref 0.5–2.0)
Lactic Acid, Venous: 3.5 mmol/L (ref 0.5–2.0)
Lactic Acid, Venous: 1.7 mmol/L (ref 0.5–2.0)

## 2015-02-25 LAB — LIPASE, BLOOD: Lipase: 27 U/L (ref 11–51)

## 2015-02-25 LAB — PROCALCITONIN: Procalcitonin: 0.29 ng/mL

## 2015-02-25 LAB — MRSA PCR SCREENING: MRSA BY PCR: POSITIVE — AB

## 2015-02-25 LAB — MAGNESIUM: Magnesium: 1.5 mg/dL — ABNORMAL LOW (ref 1.7–2.4)

## 2015-02-25 MED ORDER — BISACODYL 5 MG PO TBEC
5.0000 mg | DELAYED_RELEASE_TABLET | Freq: Every day | ORAL | Status: DC
  Administered 2015-02-26 – 2015-03-01 (×4): 5 mg via ORAL
  Filled 2015-02-25 (×5): qty 1

## 2015-02-25 MED ORDER — PHENOL 1.4 % MT LIQD
1.0000 | OROMUCOSAL | Status: DC | PRN
  Filled 2015-02-25: qty 177

## 2015-02-25 MED ORDER — CHLORHEXIDINE GLUCONATE CLOTH 2 % EX PADS
6.0000 | MEDICATED_PAD | Freq: Every day | CUTANEOUS | Status: DC
  Administered 2015-02-26 – 2015-03-01 (×4): 6 via TOPICAL

## 2015-02-25 MED ORDER — POTASSIUM CHLORIDE 10 MEQ/100ML IV SOLN
10.0000 meq | INTRAVENOUS | Status: AC
  Administered 2015-02-25 (×2): 10 meq via INTRAVENOUS
  Filled 2015-02-25 (×2): qty 100

## 2015-02-25 MED ORDER — MUPIROCIN 2 % EX OINT
1.0000 "application " | TOPICAL_OINTMENT | Freq: Two times a day (BID) | CUTANEOUS | Status: DC
  Administered 2015-02-25 – 2015-03-01 (×8): 1 via NASAL
  Filled 2015-02-25 (×3): qty 22

## 2015-02-25 MED ORDER — IPRATROPIUM BROMIDE 0.02 % IN SOLN
1.0000 mg | Freq: Once | RESPIRATORY_TRACT | Status: AC
  Administered 2015-02-25: 1 mg via RESPIRATORY_TRACT
  Filled 2015-02-25: qty 5

## 2015-02-25 MED ORDER — CEFEPIME HCL 2 G IJ SOLR
2.0000 g | Freq: Once | INTRAMUSCULAR | Status: DC
  Filled 2015-02-25: qty 2

## 2015-02-25 MED ORDER — WHITE PETROLATUM GEL
Status: AC
  Administered 2015-02-25: 15:00:00
  Filled 2015-02-25: qty 1

## 2015-02-25 MED ORDER — POLYETHYLENE GLYCOL 3350 17 G PO PACK
17.0000 g | PACK | Freq: Every day | ORAL | Status: DC
  Administered 2015-02-26 – 2015-02-27 (×2): 17 g via ORAL
  Filled 2015-02-25 (×4): qty 1

## 2015-02-25 MED ORDER — DEXTROSE 5 % IV SOLN: 2.0000 g | Freq: Once | INTRAVENOUS | Status: DC

## 2015-02-25 MED ORDER — SODIUM CHLORIDE 0.9 % IV BOLUS (SEPSIS)
1000.0000 mL | Freq: Once | INTRAVENOUS | Status: AC
  Administered 2015-02-25: 1000 mL via INTRAVENOUS

## 2015-02-25 MED ORDER — ALBUTEROL (5 MG/ML) CONTINUOUS INHALATION SOLN
10.0000 mg/h | INHALATION_SOLUTION | RESPIRATORY_TRACT | Status: DC
  Administered 2015-02-25: 10 mg/h via RESPIRATORY_TRACT
  Filled 2015-02-25: qty 20

## 2015-02-25 MED ORDER — MAGNESIUM SULFATE 2 GM/50ML IV SOLN
2.0000 g | Freq: Once | INTRAVENOUS | Status: AC
  Administered 2015-02-25: 2 g via INTRAVENOUS
  Filled 2015-02-25: qty 50

## 2015-02-25 MED ORDER — DEXAMETHASONE SODIUM PHOSPHATE 10 MG/ML IJ SOLN
10.0000 mg | Freq: Once | INTRAMUSCULAR | Status: AC
  Administered 2015-02-25: 10 mg via INTRAVENOUS
  Filled 2015-02-25: qty 1

## 2015-02-25 MED ORDER — TAMSULOSIN HCL 0.4 MG PO CAPS
0.4000 mg | ORAL_CAPSULE | Freq: Every day | ORAL | Status: DC
  Administered 2015-02-25 – 2015-02-28 (×4): 0.4 mg via ORAL
  Filled 2015-02-25 (×4): qty 1

## 2015-02-25 MED ORDER — VANCOMYCIN HCL IN DEXTROSE 1-5 GM/200ML-% IV SOLN
1000.0000 mg | INTRAVENOUS | Status: DC
  Administered 2015-02-26: 1000 mg via INTRAVENOUS
  Filled 2015-02-25: qty 200

## 2015-02-25 MED ORDER — IPRATROPIUM-ALBUTEROL 0.5-2.5 (3) MG/3ML IN SOLN: 3.0000 mL | Freq: Once | RESPIRATORY_TRACT | Status: DC

## 2015-02-25 MED ORDER — FUROSEMIDE 10 MG/ML IJ SOLN
40.0000 mg | Freq: Two times a day (BID) | INTRAMUSCULAR | Status: AC
  Administered 2015-02-25 (×2): 40 mg via INTRAVENOUS
  Filled 2015-02-25 (×2): qty 4

## 2015-02-25 MED ORDER — MIRABEGRON ER 50 MG PO TB24
50.0000 mg | ORAL_TABLET | Freq: Every day | ORAL | Status: DC
  Administered 2015-02-25 – 2015-03-01 (×5): 50 mg via ORAL
  Filled 2015-02-25 (×6): qty 1

## 2015-02-25 MED ORDER — WARFARIN - PHARMACIST DOSING INPATIENT
Freq: Every day | Status: DC
  Administered 2015-02-25 – 2015-02-26 (×2)

## 2015-02-25 MED ORDER — BACLOFEN 10 MG PO TABS
5.0000 mg | ORAL_TABLET | Freq: Every day | ORAL | Status: DC
  Administered 2015-02-25 – 2015-03-01 (×5): 5 mg via ORAL
  Filled 2015-02-25 (×5): qty 1

## 2015-02-25 MED ORDER — WARFARIN SODIUM 3 MG PO TABS
3.0000 mg | ORAL_TABLET | Freq: Once | ORAL | Status: AC
  Administered 2015-02-25: 3 mg via ORAL
  Filled 2015-02-25 (×2): qty 1

## 2015-02-25 MED ORDER — PROMETHAZINE HCL 25 MG/ML IJ SOLN
25.0000 mg | Freq: Once | INTRAMUSCULAR | Status: AC
Start: 2015-02-25 — End: 2015-02-25
  Administered 2015-02-25: 25 mg via INTRAVENOUS
  Filled 2015-02-25: qty 1

## 2015-02-25 MED ORDER — VANCOMYCIN HCL 10 G IV SOLR
1500.0000 mg | Freq: Once | INTRAVENOUS | Status: AC
  Administered 2015-02-25: 1500 mg via INTRAVENOUS
  Filled 2015-02-25: qty 1500

## 2015-02-25 MED ORDER — DEXTROSE 5 % IV SOLN
1.0000 g | Freq: Three times a day (TID) | INTRAVENOUS | Status: DC
  Administered 2015-02-25 – 2015-02-28 (×10): 1 g via INTRAVENOUS
  Filled 2015-02-25 (×13): qty 1

## 2015-02-25 MED ORDER — METOCLOPRAMIDE HCL 5 MG/ML IJ SOLN
10.0000 mg | Freq: Once | INTRAMUSCULAR | Status: DC
  Filled 2015-02-25: qty 2

## 2015-02-25 MED ORDER — TRAMADOL HCL 50 MG PO TABS: 50.0000 mg | ORAL_TABLET | Freq: Four times a day (QID) | ORAL | Status: DC | PRN

## 2015-02-25 MED ORDER — DIAZEPAM 5 MG PO TABS
5.0000 mg | ORAL_TABLET | Freq: Two times a day (BID) | ORAL | Status: DC
  Administered 2015-02-25 – 2015-03-01 (×9): 5 mg via ORAL
  Filled 2015-02-25 (×9): qty 1

## 2015-02-25 NOTE — ED Notes (Signed)
Potassium running slow due to pt c/o of pain at IV site.

## 2015-02-25 NOTE — Progress Notes (Signed)
ANTIBIOTIC CONSULT NOTE - INITIAL  Pharmacy Consult for Vancomycin and Aztreonam Indication: pneumonia  Allergies  Allergen Reactions  . Penicillins Other (See Comments)    Unknown on MAR  . Senna Nausea And Vomiting  . Sulfa Antibiotics Other (See Comments)    [facility]    Patient Measurements: Height: 5\' 10"  (177.8 cm) Weight: 224 lb (101.606 kg) IBW/kg (Calculated) : 73  Vital Signs: Temp: 100.8 F (38.2 C) (01/07 0625) Temp Source: Rectal (01/07 0625) BP: 109/70 mmHg (01/07 0645) Pulse Rate: 113 (01/07 0645) Intake/Output from previous day:   Intake/Output from this shift:    Labs:  Recent Labs  02/22/15 1430 02/23/15 0558 02/24/15 0739 02/25/15 0508 02/25/15 0511  WBC  --  13.5* 16.4*  --  18.5*  HGB  --  12.8* 15.5 18.7* 16.3  PLT  --  357 405*  --  412*  CREATININE 0.81  --   --  1.20 1.32*   Estimated Creatinine Clearance: 69.3 mL/min (by C-G formula based on Cr of 1.32). No results for input(s): VANCOTROUGH, VANCOPEAK, VANCORANDOM, GENTTROUGH, GENTPEAK, GENTRANDOM, TOBRATROUGH, TOBRAPEAK, TOBRARND, AMIKACINPEAK, AMIKACINTROU, AMIKACIN in the last 72 hours.   Microbiology: Recent Results (from the past 720 hour(s))  Blood culture (routine x 2)     Status: None (Preliminary result)   Collection Time: 02/20/15  7:02 AM  Result Value Ref Range Status   Specimen Description BLOOD LEFT ANTECUBITAL  Final   Special Requests BOTTLES DRAWN AEROBIC AND ANAEROBIC 5ML  Final   Culture   Final    NO GROWTH 4 DAYS [facility]formed at [facility]    Report Status PENDING  Incomplete  Blood culture (routine x 2)     Status: None (Preliminary result)   Collection Time: 02/20/15  7:10 AM  Result Value Ref Range Status   Specimen Description BLOOD RIGHT ANTECUBITAL  Final   Special Requests BOTTLES DRAWN AEROBIC AND ANAEROBIC 5ML  Final   Culture   Final    NO GROWTH 4 DAYS Performed at [facility]    Report Status PENDING  Incomplete  Culture,  Group A Strep     Status: None   Collection Time: 02/20/15  8:52 AM  Result Value Ref Range Status   Strep A Culture Negative  Final    Comment: (NOTE) Performed At: [facility] [address] [place], [place] [id number] Mila HomerHancock William F MD WU:[id number]:[phone]   MRSA PCR Screening     Status: Abnormal   Collection Time: 02/20/15  2:06 PM  Result Value Ref Range Status   MRSA by PCR POSITIVE (A) NEGATIVE Final    Comment:        The GeneXpert MRSA Assay (FDA approved for NASAL specimens only), is one component of a comprehensive MRSA colonization surveillance program. It is not intended to diagnose MRSA infection nor to guide or monitor treatment for MRSA infections. RESULT CALLED TO, READ BACK BY AND VERIFIED WITH: Patrick NorthK. ALEXANDER RN 16:05 02/20/15 ([email or web address])   Rapid strep screen (not at [facility])     Status: None   Collection Time: 02/20/15  5:19 PM  Result Value Ref Range Status   Streptococcus, Group A Screen (Direct) NEGATIVE NEGATIVE Final    Comment: (NOTE) A Rapid Antigen test may result negative if the antigen level in the sample is below the detection level of this test. The FDA has not cleared this test as a stand-alone test therefore the rapid antigen negative result has reflexed to a Group A  Strep culture.   Culture, Group A Strep     Status: None   Collection Time: 02/20/15  5:19 PM  Result Value Ref Range Status   Strep A Culture Negative  Final    Comment: (NOTE) Performed At: [facility] [address] [place], [place] [id number] Mila Homer MD KG:[id number]    Assessment: 65 y.o. male with SOB/hypoxia for empiric antibiotics.  Vancomycin 1500 mg IV given in ED at 0700     Goal of Therapy:  Vancomycin trough level 15-20 mcg/ml  Plan:  Vancomycin 1 g IV q24h Aztreonam 1 g IV q8h   Kassiah Mccrory, Gary Fleet 02/25/2015,7:18 AM

## 2015-02-25 NOTE — ED Notes (Signed)
[facility], around 3am woke up with SOB, was recently here for pneumonia.

## 2015-02-25 NOTE — ED Provider Notes (Signed)
5:21 AM Called to bedside over concern for sats in the 60-70's on nonrebreather. Instructed nurse to push IV steroids and to start patient on DuoNeb until respiratory able to initiate continuous nebulizer treatment. Also advised nurse to have CPAP at bedside to assist in oxygenation. Patient states he is a DNR and does not desire intubation. Sats 98-100% on nonrebreather with good waveform, though patient does appear fatigued.   Daniel MaduraKelly Terris Germano, PA-C 02/25/15 [phone]  Tomasita CrumbleAdeleke Oni, MD 02/25/15 [phone]

## 2015-02-25 NOTE — ED Notes (Signed)
Admitting at bedside 

## 2015-02-25 NOTE — H&P (Signed)
Family Medicine Teaching [facility] Admission History and Physical Service Pager: [phone]  [name] Medical record number: [id number] Date of birth: 21-Jun-1950 Age: 65 y.o. Gender: male  Primary Care Provider: MCDIARMID,TODD D, MD Consultants: none Code Status: DNR/DNI (discussed on admission)  Chief Complaint: shortness of breath, hypoxia  Assessment and Plan: Daniel Wolfe is a 65 y.o. male presenting with sore throat . PMH is significant for Aneurysm, Clotting disorder, Depression, Vitamin D deficiency disease, Constipation, GERD, Neuropathy, Urinary retention, Anxiety, Spinal paraplegia, Neurogenic bladder, Benign prostate hyperplasia, and Brain aneurysm.  # Multifocal pneumonia/ Sepsis: Patient presenting back to ED after being discharged 1/6 with worsening multifocal pneumonia.  Desaturations requiring escalation to BiPap.  Tachycardic on exam.  Afebrile. BP 121/73.  Was discharged on Levaquin.  This was stopped in ED and patient was broadened out to Vanc and Zosyn. ABG: pH 7.327, pCO2: 57, pO2 17, Bicarb 29.8.  WBC 18.5, Neuts 14. CXR (1/7) with worsening R mid lung opacification.  Meets sepsis criteria with increased RR, increased HR and known pneumonia. - Admit to FPTS under Dr Gwendolyn Grant, SDU for Bipap and close observation of respiratory status - Telemetry - Vitals per floor protocol - O2 prn saturations <92%, on Bipap currently.  Wean to [place] as able. - Continue Vanc (1/7- ) and Cefepime x1 (1/7).  Will switch to Vanc and Aztreonam (patient has a PCN allergy)  - Duonebs q4, Albuterol q2 prn - f/u blood cultures - Stat lactic acid - Trend procalcitonin - CBC in am - Fluids per sepsis protocol, s/p 1L in ED - Bipap currently, wean as able.  # Hypokalemia: initially 2.9>3.1 after 2 rounds of potassium 10 meq IV - Potassium IV 10 meq x3 - Check Mg - BMP in am  # Urinary Retention with Suprapubic Cath: Neurogenic bladder due to paraplegia. Last changed  December 15th, not due for a new one. Does not appear infected on admission. - Continue to ensure cath site is clean and has good urine output - Foley care  # Protein S deficiency: Problem not in Epic but per patient history. Has had DVT in past. Is currently on Coumadin for anticoagulation.  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 Moves extremities but unable to ambulate and utilizes a wheelchair. - Multiple repetitive medications in MAR incl Lyrica, Gabapentin, Baclofen, Seroquel, Cymbalta, Zoloft.   - Will wait for pharm med rec before ordering these.  # Social: Lives in ALF - c/s SW to assist this patient to return when stable for discharge.  FEN/GI: Fluids per sepsis protocol, heart healthy dietwhen off of Bipap Prophylaxis: Coumadin per pharmacy  Disposition: Admit to SDU  History of Present Illness:  Daniel Wolfe is a 65 y.o. male presenting with respiratory distress  Patient recently admitted for HCAP, hypoxia, acute pharyngitis and was discharged 02/24/15 on 1L O2 and Levaquin.  Patient notes that he was breathing ok when he got back to the ALF yesterday.  He notes that he was awoken from sleep around 3am with shortness of breath and cough.  He denies fevers or chills.  Sore throat also resolved.  No abdominal pain, nausea, vomiting.  Additionally, patient is non ambulatory and has a chronic suprapubic catheter.  He resides at an ALF.  He desires to have his son updated.  "Tell him like it is."  He reiterates that  he is a DNR/DNI  Review Of Systems: Per HPI with the following additions: none Otherwise the remainder of the systems were negative.  Patient Active Problem List   Diagnosis Date Noted  . Sepsis due to pneumonia (HCC) 02/25/2015  . HCAP (healthcare-associated pneumonia) 02/23/2015  . Oxygen desaturation   . Acute pulmonary edema (HCC)    . Pneumonia   . Epiglottitis 02/20/2015  . Acute pharyngitis 02/20/2015  . Sore throat 02/20/2015  . 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 SAH (subarachnoid hemorrhage) (HCC) 08/23/2013  . Paraplegia (HCC) 08/23/2013    Past Medical History: Past Medical History  Diagnosis Date  . Aneurysm (HCC)   . Clotting disorder (HCC)   . Depressed   . Vitamin D deficiency disease   . Constipation   . GERD (gastroesophageal reflux disease)   . Neuropathy (HCC)   . Urinary retention   . Anxiety   . Spinal paraplegia (HCC)   . Neurogenic bladder   . Benign prostate hyperplasia   . Brain aneurysm     Past Surgical History: Past Surgical History  Procedure Laterality Date  . Cholecystectomy    . Splenectomy      Social History: Social History  Substance Use Topics  . Smoking status: Never Smoker   . Smokeless tobacco: Never Used  . Alcohol Use: No   Additional social history: none  Please also refer to relevant sections of EMR.  Family History: Family History  Problem Relation Age of Onset  . Lung cancer Mother   . Other Father     lung problems/not sure of cause of death    Allergies and Medications: Allergies  Allergen Reactions  . Penicillins Other (See Comments)    Unknown on MAR  . Senna Nausea And Vomiting  . Sulfa Antibiotics Other (See Comments)    Unknown on MAR   No current facility-administered medications on file prior to encounter.   Current Outpatient Prescriptions on File Prior to Encounter  Medication Sig Dispense Refill  . acetaminophen (TYLENOL) 325 MG tablet Take 650 mg by mouth 2 (two) times daily.     . baclofen (LIORESAL) 10 MG tablet Take 5 mg by mouth daily.    . bisacodyl (DULCOLAX) 5 MG EC tablet Take 5 mg by mouth daily.     . Cranberry 250 MG CAPS Take 1 capsule by mouth 2 (two) times daily.    . DULoxetine (CYMBALTA) 30 MG capsule Take 30 mg by mouth 3 (three) times daily.     . diazepam (VALIUM) 5 MG tablet Take 5 mg by mouth  2 (two) times daily.    . furosemide (LASIX) 40 MG tablet Take 1 tablet (40 mg total) by mouth daily. 30 tablet 0  . gabapentin (NEURONTIN) 600 MG tablet Take 600 mg by mouth 3 (three) times daily.    Marland Kitchen. levofloxacin (LEVAQUIN) 750 MG tablet Take 1 tablet (750 mg total) by mouth daily. 4 tablet 0  . loperamide (IMODIUM A-D) 2 MG tablet Take 2 mg by mouth as needed for diarrhea or loose stools.    Marland Kitchen. LORazepam (ATIVAN) 0.5 MG tablet Take 0.5 mg by mouth daily as needed for anxiety (anxiety).     Marland Kitchen. LYRICA 75 MG capsule Take 75 mg by mouth 2 (two) times daily.    . mirabegron ER (MYRBETRIQ) 50 MG TB24 tablet Take 50 mg by mouth daily.    . mupirocin ointment (BACTROBAN) 2 % Place 1 application into the nose 3 (three) times daily  as needed (to supra pubic area to prevent infection).     . ondansetron (ZOFRAN-ODT) 4 MG disintegrating tablet Take 4 mg by mouth every 6 (six) hours as needed for nausea or vomiting.    . phenol (CHLORASEPTIC) 1.4 % LIQD Use as directed 1 spray in the mouth or throat as needed for throat irritation / pain. 1 Bottle 0  . POLYETHYLENE GLYCOL 3350 PO Take 17 g/mL by mouth daily.     . QUEtiapine (SEROQUEL) 50 MG tablet Take 50-100 mg by mouth 2 (two) times daily. 50 mg in the am and 100 mg at bedtime.    . ranitidine (ZANTAC) 150 MG tablet Take 150 mg by mouth 2 (two) times daily.    . sertraline (ZOLOFT) 100 MG tablet Take 100 mg by mouth daily.    . tamsulosin (FLOMAX) 0.4 MG CAPS capsule Take 0.4 mg by mouth at bedtime.     . traMADol (ULTRAM) 50 MG tablet Take 50 mg by mouth every 6 (six) hours as needed for moderate pain (pain).     Marland Kitchen warfarin (COUMADIN) 6 MG tablet Take 6 mg by mouth daily.      Objective: BP 121/73 mmHg  Pulse 115  Temp(Src) 100.8 F (38.2 C) (Rectal)  Resp 21  Ht 5\' 10"  (1.778 m)  Wt 224 lb (101.606 kg)  BMI 32.14 kg/m2  SpO2 100% Exam: General: awake, alert, Bipap in place Eyes: sclera white ENTM: o/p not examined as Bipap in place Neck:  supple Cardiovascular: tachycardic, no mumurs Respiratory: coarse breath sounds throughout, slightly decreased breath sounds on RL and RM lung fields, speaks in full sentences on Bipap, saturating at 100% Abdomen: soft, NT/ND, +BS, suprapubic catheter draining yellow urine.  Some leakage noted around catheter but no surrounding evidence of infection. MSK: moves all limbs but not ambulatory, No edema, +2 posterior tibial pulses Skin: no rashes, skin surrounding catheter does not appear infected Neuro: follows commands Psych: mood stable, speech normal, good eye contact  Labs and Imaging: CBC BMET   Recent Labs Lab 02/25/15 0511  WBC 18.5*  HGB 16.3  HCT 50.1  PLT 412*    Recent Labs Lab 02/25/15 0511  NA 141  K 3.1*  CL 97*  CO2 28  BUN 14  CREATININE 1.32*  GLUCOSE 108*  CALCIUM 9.1     Dg Chest Port 1 View  02/25/2015  CLINICAL DATA:  Chest pain and shortness of breath today. EXAM: PORTABLE CHEST 1 VIEW COMPARISON:  Chest radiograph February 23, 2015 FINDINGS: The cardiac silhouette appears mildly enlarged, mediastinal silhouette is nonsuspicious. Low inspiratory examination. Patchy RIGHT mid lung zone and bibasilar consolidation. Elevated RIGHT hemidiaphragm. No pleural effusion. No pneumothorax. Soft tissue planes and included osseous structure nonsuspicious. Surgical clips project in the abdomen. IMPRESSION: Worsening multi focal consolidation concerning for pneumonia. Mild cardiomegaly. Electronically Signed   By: Awilda Metro M.D.   On: 02/25/2015 05:24   Dg Chest Port 1 View  02/23/2015  CLINICAL DATA:  Hypoxia EXAM: PORTABLE CHEST - 1 VIEW COMPARISON:  02/19/2015 FINDINGS: Cardiac shadow is mildly enlarged but stable. Elevation the right hemidiaphragm is again seen. Bibasilar infiltrates are noted better visualized due to the improved inspiratory effort. No bony abnormality is noted. IMPRESSION: Persistent bibasilar infiltrates. Electronically Signed   By: Alcide Clever  M.D.   On: 02/23/2015 12:01   Raliegh Ip, DO 02/25/2015, 6:52 AM PGY-2, [facility] FPTS Intern pager: [phone], text pages welcome

## 2015-02-25 NOTE — Progress Notes (Signed)
ANTICOAGULATION CONSULT NOTE - Initial Consult  Pharmacy Consult for Coumadin Indication: Protein S deficiency  Allergies  Allergen Reactions  . Penicillins Other (See Comments)    Unknown on MAR  . Senna Nausea And Vomiting  . Sulfa Antibiotics Other (See Comments)    [facility]    Patient Measurements: Height: 5\' 10"  (177.8 cm) Weight: 224 lb (101.606 kg) IBW/kg (Calculated) : 73 Vital Signs: Temp: 100.8 F (38.2 C) (01/07 0625) Temp Source: Rectal (01/07 0625) BP: 121/73 mmHg (01/07 0448) Pulse Rate: 115 (01/07 0620)  Labs:  Recent Labs  02/22/15 1430 02/23/15 0558 02/24/15 0739 02/25/15 0508 02/25/15 0511  HGB  --  12.8* 15.5 18.7* 16.3  HCT  --  40.6 47.1 55.0* 50.1  PLT  --  357 405*  --  412*  LABPROT  --  32.8* 27.0*  --   --   INR  --  3.29* 2.54*  --   --   CREATININE 0.81  --   --  1.20 1.32*    Estimated Creatinine Clearance: 69.3 mL/min (by C-G formula based on Cr of 1.32).   Medical History: Past Medical History  Diagnosis Date  . Aneurysm (HCC)   . Clotting disorder (HCC)   . Depressed   . Vitamin D deficiency disease   . Constipation   . GERD (gastroesophageal reflux disease)   . Neuropathy (HCC)   . Urinary retention   . Anxiety   . Spinal paraplegia (HCC)   . Neurogenic bladder   . Benign prostate hyperplasia   . Brain aneurysm     Medications:  Medication history pending  Assessment: 65 y.o. male readmitted with PNA, h/p DVT and Protein S deficiency, for Coumadin. Received Coumadin 3 mg 1/6 prior to discharge.  Coumadin previously held 1/2-1/5 due to supratherapeutic INR  Goal of Therapy:  INR 2-3 Monitor platelets by anticoagulation protocol: Yes   Plan:  Coumadin 3 mg today Daily INR  Tysheena Ginzburg, Gary FleetGregory Vernon 02/25/2015,6:33 AM

## 2015-02-25 NOTE — Progress Notes (Signed)
Internal Med service notified by text page of positive nasal MRSA result.  Contact Isolation ordered per protocol.

## 2015-02-25 NOTE — ED Provider Notes (Addendum)
CSN: [id number]     Arrival date & time 02/25/15  0442 History   First MD Initiated Contact with Patient 02/25/15 0444     Chief Complaint  Patient presents with  . Shortness of Breath     (Consider location/radiation/quality/duration/timing/severity/associated sxs/prior Treatment) HPI   This is a 65 year old male with a past medical history of aneurysm, COPD, recent admission for pneumonia sensitive worsening shortness of breath. Patient was initially in the 70% range on room air. He is in significant distress. He states ever since he had his pneumonia he's had worsening chest pain and a cough. He currently states he is weak all over. He isn't compliant with his antibiotics since his recent discharge. Paramedics did not give the patient any medication. History is limited due to the acuity of his condition.  10 Systems reviewed and are negative for acute change except as noted in the HPI.    Past Medical History  Diagnosis Date  . Aneurysm (HCC)   . Clotting disorder (HCC)   . Depressed   . Vitamin D deficiency disease   . Constipation   . GERD (gastroesophageal reflux disease)   . Neuropathy (HCC)   . Urinary retention   . Anxiety   . Spinal paraplegia (HCC)   . Neurogenic bladder   . Benign prostate hyperplasia   . Brain aneurysm    Past Surgical History  Procedure Laterality Date  . Cholecystectomy    . Splenectomy     Family History  Problem Relation Age of Onset  . Lung cancer Mother   . Other Father     lung problems/not sure of cause of death   Social History  Substance Use Topics  . Smoking status: Never Smoker   . Smokeless tobacco: Never Used  . Alcohol Use: No    Review of Systems    Allergies  Penicillins; Senna; and Sulfa antibiotics  Home Medications   Prior to Admission medications   Medication Sig Start Date End Date Taking? Authorizing Provider  acetaminophen (TYLENOL) 325 MG tablet Take 650 mg by mouth 2 (two) times daily.     Historical  Provider, MD  baclofen (LIORESAL) 10 MG tablet Take 5 mg by mouth daily.    Historical Provider, MD  bisacodyl (DULCOLAX) 5 MG EC tablet Take 5 mg by mouth daily.     Historical Provider, MD  Cranberry 250 MG CAPS Take 1 capsule by mouth 2 (two) times daily.    Historical Provider, MD  diazepam (VALIUM) 5 MG tablet Take 5 mg by mouth 2 (two) times daily.    Historical Provider, MD  DULoxetine (CYMBALTA) 30 MG capsule Take 30 mg by mouth 3 (three) times daily.     Historical Provider, MD  furosemide (LASIX) 40 MG tablet Take 1 tablet (40 mg total) by mouth daily. 02/24/15 03/02/15  [name], MD  gabapentin (NEURONTIN) 600 MG tablet Take 600 mg by mouth 3 (three) times daily.    Historical Provider, MD  levofloxacin (LEVAQUIN) 750 MG tablet Take 1 tablet (750 mg total) by mouth daily. 02/24/15 02/27/15  [name], MD  loperamide (IMODIUM A-D) 2 MG tablet Take 2 mg by mouth as needed for diarrhea or loose stools.    Historical Provider, MD  LORazepam (ATIVAN) 0.5 MG tablet Take 0.5 mg by mouth daily as needed for anxiety (anxiety).     Historical Provider, MD  LYRICA 75 MG capsule Take 75 mg by mouth 2 (two) times daily. 02/02/15  Historical Provider, MD  mirabegron ER (MYRBETRIQ) 50 MG TB24 tablet Take 50 mg by mouth daily.    Historical Provider, MD  mupirocin ointment (BACTROBAN) 2 % Place 1 application into the nose 3 (three) times daily as needed (to supra pubic area to prevent infection).     Historical Provider, MD  ondansetron (ZOFRAN-ODT) 4 MG disintegrating tablet Take 4 mg by mouth every 6 (six) hours as needed for nausea or vomiting.    Historical Provider, MD  phenol (CHLORASEPTIC) 1.4 % LIQD Use as directed 1 spray in the mouth or throat as needed for throat irritation / pain. 02/24/15   [name], MD  POLYETHYLENE GLYCOL 3350 PO Take 17 g/mL by mouth daily.     Historical Provider, MD  QUEtiapine (SEROQUEL) 50 MG tablet Take 50-100 mg by mouth 2 (two) times daily.  50 mg in the am and 100 mg at bedtime.    Historical Provider, MD  ranitidine (ZANTAC) 150 MG tablet Take 150 mg by mouth 2 (two) times daily.    Historical Provider, MD  sertraline (ZOLOFT) 100 MG tablet Take 100 mg by mouth daily.    Historical Provider, MD  tamsulosin (FLOMAX) 0.4 MG CAPS capsule Take 0.4 mg by mouth at bedtime.     Historical Provider, MD  traMADol (ULTRAM) 50 MG tablet Take 50 mg by mouth every 6 (six) hours as needed for moderate pain (pain).     Historical Provider, MD  warfarin (COUMADIN) 6 MG tablet Take 6 mg by mouth daily.    Historical Provider, MD   BP 121/73 mmHg  Pulse 114  Temp(Src) 97.8 F (36.6 C) (Oral)  Resp 24  Ht 5\' 10"  (1.778 m)  Wt 224 lb (101.606 kg)  BMI 32.14 kg/m2  SpO2 100% Physical Exam  Constitutional: He is oriented to person, place, and time. Vital signs are normal. He appears well-developed and well-nourished.  Non-toxic appearance. He does not appear ill. He appears distressed.  HENT:  Head: Normocephalic and atraumatic.  Nose: Nose normal.  Mouth/Throat: No oropharyngeal exudate.  Dry oropharynx no oral swelling seen.  Eyes: Conjunctivae and EOM are normal. Pupils are equal, round, and reactive to light. No scleral icterus.  Neck: Normal range of motion. Neck supple. No tracheal deviation, no edema, no erythema and normal range of motion present. No thyroid mass and no thyromegaly present.  Cardiovascular: Regular rhythm, S1 normal, S2 normal, normal heart sounds, intact distal pulses and normal pulses.  Exam reveals no gallop and no friction rub.   No murmur heard. Tachycardic  Pulmonary/Chest: He is in respiratory distress. He has wheezes. He has no rhonchi. He has no rales.  Tachypnea, increased work of breathing, use of accessory muscles.  Abdominal: Soft. Normal appearance and bowel sounds are normal. He exhibits no distension, no ascites and no mass. There is no hepatosplenomegaly. There is no tenderness. There is no rebound, no  guarding and no CVA tenderness.  Musculoskeletal: Normal range of motion. He exhibits edema. He exhibits no tenderness.  Lymphadenopathy:    He has no cervical adenopathy.  Neurological: He is alert and oriented to person, place, and time. He has normal strength. No cranial nerve deficit or sensory deficit. He exhibits normal muscle tone.  Skin: Skin is warm and intact. No petechiae and no rash noted. He is diaphoretic. No erythema. No pallor.  Nursing note and vitals reviewed.   ED Course  Procedures (including critical care time) Labs Review Labs Reviewed  CBC WITH DIFFERENTIAL/PLATELET -  Abnormal; Notable for the following:    RDW 17.0 (*)    Platelets 412 (*)    All other components within normal limits  COMPREHENSIVE METABOLIC PANEL - Abnormal; Notable for the following:    Potassium 3.1 (*)    Chloride 97 (*)    Glucose, Bld 108 (*)    Creatinine, Ser 1.32 (*)    Albumin 3.2 (*)    ALT 14 (*)    Total Bilirubin 1.3 (*)    GFR calc non Af Amer 56 (*)    Anion gap 16 (*)    All other components within normal limits  I-STAT CHEM 8, ED - Abnormal; Notable for the following:    Potassium 2.9 (*)    Chloride 96 (*)    Glucose, Bld 106 (*)    Calcium, Ion 1.08 (*)    Hemoglobin 18.7 (*)    HCT 55.0 (*)    All other components within normal limits  I-STAT VENOUS BLOOD GAS, ED - Abnormal; Notable for the following:    pH, Ven 7.327 (*)    pCO2, Ven 57.0 (*)    pO2, Ven 17.0 (*)    Bicarbonate 29.8 (*)    All other components within normal limits  CULTURE, BLOOD (ROUTINE X 2)  CULTURE, BLOOD (ROUTINE X 2)  LIPASE, BLOOD  URINALYSIS, ROUTINE W REFLEX MICROSCOPIC (NOT AT [facility])  PROTIME-INR    Imaging Review Dg Chest Port 1 View  02/25/2015  CLINICAL DATA:  Chest pain and shortness of breath today. EXAM: PORTABLE CHEST 1 VIEW COMPARISON:  Chest radiograph February 23, 2015 FINDINGS: The cardiac silhouette appears mildly enlarged, mediastinal silhouette is nonsuspicious. Low  inspiratory examination. Patchy RIGHT mid lung zone and bibasilar consolidation. Elevated RIGHT hemidiaphragm. No pleural effusion. No pneumothorax. Soft tissue planes and included osseous structure nonsuspicious. Surgical clips project in the abdomen. IMPRESSION: Worsening multi focal consolidation concerning for pneumonia. Mild cardiomegaly. Electronically Signed   By: Awilda Metro M.D.   On: 02/25/2015 05:24   Dg Chest Port 1 View  02/23/2015  CLINICAL DATA:  Hypoxia EXAM: PORTABLE CHEST - 1 VIEW COMPARISON:  02/19/2015 FINDINGS: Cardiac shadow is mildly enlarged but stable. Elevation the right hemidiaphragm is again seen. Bibasilar infiltrates are noted better visualized due to the improved inspiratory effort. No bony abnormality is noted. IMPRESSION: Persistent bibasilar infiltrates. Electronically Signed   By: Alcide Clever M.D.   On: 02/23/2015 12:01   I have personally reviewed and evaluated these images and lab results as part of my medical decision-making.   EKG Interpretation   Date/Time:  Saturday February 25 2015 05:00:58 EST Ventricular Rate:  116 PR Interval:  129 QRS Duration: 103 QT Interval:  367 QTC Calculation: 510 R Axis:   108 Text Interpretation:  Sinus tachycardia Right axis deviation Low voltage,  precordial leads STE in aVR and diffuse STD Prolonged QT interval  Confirmed by Erroll Luna [phone]) on 02/25/2015 5:48:49 AM      MDM   Final diagnoses:  None   patient presents to the emergency department for severe shortness of breath. Patient is in acute distress. He was initially placed on nonrebreather oxygen saturation rose to 90%. He then became hypoxic again, patient was placed on BiPAP. He was given continuous epidural treatment, ipratropium, Decadron. Chest x-ray reveals worsening multifocal pneumonia. Blood cultures were drawn, patient was given vancomycin cefepime for treatment. Family medicine was paged for repeat admission. Patient will be admitted  to the stepdown unit. EKG shows diffuse  ST depressions, likely not cardiac ischemia, likely representing his demand from sepsis. Potassium is 2.8 patient was given IV potassium replacement.  I ordered CTA of chest initially for possible PE.  Also ordered CT of neck to eval for prior possible epiglottis.  CRITICAL CARE Performed by: Tomasita CrumbleNI,Taelon Bendorf   Total critical care time: 40 minutes - respiratory distress  Critical care time was exclusive of separately billable procedures and treating other patients.  Critical care was necessary to treat or prevent imminent or life-threatening deterioration.  Critical care was time spent personally by me on the following activities: development of treatment plan with patient and/or surrogate as well as nursing, discussions with consultants, evaluation of patient's response to treatment, examination of patient, obtaining history from patient or surrogate, ordering and performing treatments and interventions, ordering and review of laboratory studies, ordering and review of radiographic studies, pulse oximetry and re-evaluation of patient's condition.   Tomasita CrumbleAdeleke Daneille Desilva, MD 02/25/15 [id number]  Tomasita CrumbleAdeleke Myer Bohlman, MD 02/25/15 [id number]

## 2015-02-25 NOTE — ED Notes (Signed)
Meds ordered from Pharmacy 

## 2015-02-26 ENCOUNTER — Encounter (HOSPITAL_COMMUNITY): Payer: Self-pay | Admitting: Cardiology

## 2015-02-26 ENCOUNTER — Inpatient Hospital Stay (HOSPITAL_COMMUNITY): Payer: Commercial Managed Care - HMO

## 2015-02-26 DIAGNOSIS — R0600 Dyspnea, unspecified: Secondary | ICD-10-CM | POA: Diagnosis present

## 2015-02-26 DIAGNOSIS — Z7901 Long term (current) use of anticoagulants: Secondary | ICD-10-CM

## 2015-02-26 DIAGNOSIS — D689 Coagulation defect, unspecified: Secondary | ICD-10-CM | POA: Diagnosis present

## 2015-02-26 DIAGNOSIS — R9431 Abnormal electrocardiogram [ECG] [EKG]: Secondary | ICD-10-CM | POA: Diagnosis not present

## 2015-02-26 DIAGNOSIS — E876 Hypokalemia: Secondary | ICD-10-CM | POA: Diagnosis not present

## 2015-02-26 DIAGNOSIS — J810 Acute pulmonary edema: Secondary | ICD-10-CM

## 2015-02-26 LAB — BASIC METABOLIC PANEL
ANION GAP: 9 (ref 5–15)
BUN: 18 mg/dL (ref 6–20)
CALCIUM: 9 mg/dL (ref 8.9–10.3)
CO2: 30 mmol/L (ref 22–32)
CREATININE: 1.05 mg/dL (ref 0.61–1.24)
Chloride: 101 mmol/L (ref 101–111)
GFR calc Af Amer: >60 mL/min (ref >60)
GLUCOSE: 118 mg/dL — AB (ref 65–99)
Potassium: 3.2 mmol/L — ABNORMAL LOW (ref 3.5–5.1)
Sodium: 140 mmol/L (ref 135–145)

## 2015-02-26 LAB — CBC
HCT: 44.4 % (ref 39.0–52.0)
Hemoglobin: 14.6 g/dL (ref 13.0–17.0)
MCH: 30.9 pg (ref 26.0–34.0)
MCHC: 32.9 g/dL (ref 30.0–36.0)
MCV: 93.9 fL (ref 78.0–100.0)
PLATELETS: 462 10*3/uL — AB (ref 150–400)
RBC: 4.73 MIL/uL (ref 4.22–5.81)
RDW: 17.3 % — AB (ref 11.5–15.5)
WBC: 21.8 10*3/uL — AB (ref 4.0–10.5)

## 2015-02-26 LAB — PROTIME-INR
INR: 2.43 — ABNORMAL HIGH (ref 0.00–1.49)
Prothrombin Time: 26.1 seconds — ABNORMAL HIGH (ref 11.6–15.2)

## 2015-02-26 LAB — MAGNESIUM: MAGNESIUM: 1.9 mg/dL (ref 1.7–2.4)

## 2015-02-26 LAB — TROPONIN I: TROPONIN I: 0.09 ng/mL — AB (ref <0.031)

## 2015-02-26 MED ORDER — POTASSIUM CHLORIDE 10 MEQ/100ML IV SOLN
10.0000 meq | INTRAVENOUS | Status: AC
Start: 2015-02-26 — End: 2015-02-26
  Administered 2015-02-26 (×4): 10 meq via INTRAVENOUS
  Filled 2015-02-26 (×4): qty 100

## 2015-02-26 MED ORDER — VANCOMYCIN HCL IN DEXTROSE 1-5 GM/200ML-% IV SOLN
1000.0000 mg | Freq: Three times a day (TID) | INTRAVENOUS | Status: DC
  Administered 2015-02-26 – 2015-02-28 (×6): 1000 mg via INTRAVENOUS
  Filled 2015-02-26 (×7): qty 200

## 2015-02-26 MED ORDER — PROMETHAZINE HCL 25 MG PO TABS
12.5000 mg | ORAL_TABLET | Freq: Four times a day (QID) | ORAL | Status: DC | PRN
  Filled 2015-02-26: qty 1

## 2015-02-26 MED ORDER — PROMETHAZINE HCL 25 MG PO TABS
12.5000 mg | ORAL_TABLET | Freq: Four times a day (QID) | ORAL | Status: DC | PRN
  Administered 2015-02-26: 12.5 mg via ORAL
  Filled 2015-02-26: qty 1

## 2015-02-26 MED ORDER — PROMETHAZINE HCL 25 MG/ML IJ SOLN
12.5000 mg | Freq: Four times a day (QID) | INTRAMUSCULAR | Status: DC | PRN
  Administered 2015-02-26 – 2015-02-28 (×6): 12.5 mg via INTRAVENOUS
  Filled 2015-02-26 (×7): qty 1

## 2015-02-26 MED ORDER — PROMETHAZINE HCL 12.5 MG RE SUPP
12.5000 mg | Freq: Four times a day (QID) | RECTAL | Status: DC | PRN
  Filled 2015-02-26: qty 1

## 2015-02-26 MED ORDER — WARFARIN SODIUM 3 MG PO TABS
3.0000 mg | ORAL_TABLET | Freq: Once | ORAL | Status: AC
  Administered 2015-02-26: 3 mg via ORAL
  Filled 2015-02-26: qty 1

## 2015-02-26 NOTE — Consult Note (Signed)
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 He is status post cerebral aneurysm coiling ([facility]. [facility]) in 2008. He moved to [place] 2 years ago. We are asked to see for slightly elevated Troponin and new TWI on EKG.            In Jan 08 patient went to the hospital with nausea, vomiting, malaise. While in the hospital patient had cardiac arrest and subsequently diagnosed with a subarachnoid hemorrhage. The patient was treated with endovascular coiling. He was in a "coma" for 4 months. The patient went to rehabilitation and skilled nursing facility following this. Patient never was able to walk after this event to 2008. He had significant weakness of his lower extremities with pain, spasms, mobility issues. He has had severe depression. He resides in an assisted living facility.  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 He was just admitted with pharyngitis 02/20/15-02/24/15. He came back to the emergency room 02/25/15 with respiratory distress. He was placed on BiPap and IV antibiotics for suspected HCAP.  Troponin is 0.09 x2. EKG shows NSR with TWI in AVL and V2-V6 which appears to be new compared with recent EKG 02/20/15. He also is noted to have a K+ of 3.2. The pt denies any cardiac history or history of chest pain.   PMHx:  Past Medical History  Diagnosis Date  . Clotting disorder (HCC)   . Depressed   . Vitamin D deficiency disease   . Constipation   . GERD (gastroesophageal reflux disease)   . Neuropathy (HCC)   . Urinary retention   . Anxiety   . Spinal paraplegia (HCC) 2008  . Neurogenic bladder   . Benign prostate hyperplasia   . Brain aneurysm 2008    s/p coiling   .  Subarachnoid hemorrhage (HCC) 2008    "coma x 4 months"    Past Surgical History  Procedure Laterality Date  . Cholecystectomy    . Splenectomy      SOCHx:  reports that he has never smoked. He has never used smokeless tobacco. He reports that he does not drink alcohol or use illicit drugs.  FAMHx: Family History  Problem Relation Age of Onset  . Lung cancer Mother   . Other Father     lung problems/not sure of cause of death    ALLERGIES: Allergies  Allergen Reactions  . Penicillins Other (See Comments)    Unknown on MAR  . Senna Nausea And Vomiting  . Sulfa Antibiotics Other (See Comments)    Unknown on MAR    ROS: Review of Systems: General: negative for chills, fever, night sweats or weight changes.  Cardiovascular: negative for chest pain, edema, orthopnea, palpitations, paroxysmal nocturnal dyspnea or shortness of breath HEENT: negative for any visual disturbances, blindness, glaucoma Dermatological: negative for rash Respiratory: negative for cough, hemoptysis, or wheezing Urologic: neurogenic bladder Abdominal: negative for nausea, vomiting, diarrhea, bright red blood per rectum, melena, or hematemesis Neurologic: paraplegia since 2008 Musculoskeletal: negative for back pain, joint pain, or swelling Psych: cooperative and appropriate All other systems reviewed and are otherwise negative except as noted above.   HOME MEDICATIONS: Prior to Admission medications   Medication Sig Start Date End Date  Taking? Authorizing Provider  acetaminophen (TYLENOL) 325 MG tablet Take 650 mg by mouth 2 (two) times daily.    Yes Historical Provider, MD  azithromycin (ZITHROMAX) 250 MG tablet Take 250-500 mg by mouth daily. Take 500mg  on the first day and 250mg  for the next 4 days   Yes Historical Provider, MD  baclofen (LIORESAL) 10 MG tablet Take 5 mg by mouth daily.   Yes Historical Provider, MD  bisacodyl (DULCOLAX) 5 MG EC tablet Take 5 mg by mouth daily.    Yes  Historical Provider, MD  Cranberry 250 MG CAPS Take 1 capsule by mouth 2 (two) times daily.   Yes Historical Provider, MD  diazepam (VALIUM) 5 MG tablet Take 5 mg by mouth 2 (two) times daily.   Yes Historical Provider, MD  DULoxetine (CYMBALTA) 30 MG capsule Take 30 mg by mouth 3 (three) times daily.    Yes Historical Provider, MD  gabapentin (NEURONTIN) 600 MG tablet Take 600 mg by mouth 3 (three) times daily.   Yes Historical Provider, MD  loperamide (IMODIUM A-D) 2 MG tablet Take 2 mg by mouth as needed for diarrhea or loose stools.   Yes Historical Provider, MD  LORazepam (ATIVAN) 0.5 MG tablet Take 0.5 mg by mouth daily as needed for anxiety (anxiety).    Yes Historical Provider, MD  LYRICA 75 MG capsule Take 75 mg by mouth 2 (two) times daily. 02/02/15  Yes Historical Provider, MD  mirabegron ER (MYRBETRIQ) 50 MG TB24 tablet Take 50 mg by mouth daily.   Yes Historical Provider, MD  mupirocin ointment (BACTROBAN) 2 % Place 1 application into the nose 3 (three) times daily as needed (to supra pubic area to prevent infection).    Yes Historical Provider, MD  ondansetron (ZOFRAN-ODT) 4 MG disintegrating tablet Take 4 mg by mouth every 6 (six) hours as needed for nausea or vomiting.   Yes Historical Provider, MD  phenol (CHLORASEPTIC) 1.4 % LIQD Use as directed 1 spray in the mouth or throat as needed for throat irritation / pain. 02/24/15  Yes [name], MD  POLYETHYLENE GLYCOL 3350 PO Take 17 g/mL by mouth daily.    Yes Historical Provider, MD  QUEtiapine (SEROQUEL) 50 MG tablet Take 50-100 mg by mouth 2 (two) times daily. 50 mg in the am and 100 mg at bedtime.   Yes Historical Provider, MD  ranitidine (ZANTAC) 150 MG tablet Take 150 mg by mouth 2 (two) times daily.   Yes Historical Provider, MD  sertraline (ZOLOFT) 100 MG tablet Take 100 mg by mouth daily.   Yes Historical Provider, MD  tamsulosin (FLOMAX) 0.4 MG CAPS capsule Take 0.4 mg by mouth at bedtime.    Yes Historical Provider,  MD  traMADol (ULTRAM) 50 MG tablet Take 50 mg by mouth every 6 (six) hours as needed for moderate pain (pain).    Yes Historical Provider, MD  warfarin (COUMADIN) 6 MG tablet Take 6 mg by mouth daily.   Yes Historical Provider, MD  furosemide (LASIX) 40 MG tablet Take 1 tablet (40 mg total) by mouth daily. 02/24/15 03/02/15  [name], MD  levofloxacin (LEVAQUIN) 750 MG tablet Take 1 tablet (750 mg total) by mouth daily. 02/24/15 02/27/15  [name], MD    HOSPITAL MEDICATIONS: I have reviewed the patient's current medications.  VITALS: Blood pressure 106/67, pulse 75, temperature 98.5 F (36.9 C), temperature source Oral, resp. rate 15, height 5\' 10"  (1.778 m), weight 217 lb (98.431 kg), SpO2 97 %.  PHYSICAL EXAM: General appearance: alert, cooperative, no distress and chronically ill appearing (pt in fetal position) Neck: no carotid bruit and no JVD Lungs: clear to auscultation bilaterally Heart: regular rate and rhythm Abdomen: non tender, not distended Extremities: no edema Pulses: diminnished Skin: pale cool dry Neurologic: Grossly normal  LABS: Results for orders placed or performed during the hospital encounter of 02/25/15 (from the past 24 hour(s))  MRSA PCR Screening     Status: Abnormal   Collection Time: 02/25/15  2:56 PM  Result Value Ref Range   MRSA by PCR POSITIVE (A) NEGATIVE  Troponin I     Status: Abnormal   Collection Time: 02/25/15 10:54 PM  Result Value Ref Range   Troponin I 0.09 (H) <0.031 ng/mL  Protime-INR     Status: Abnormal   Collection Time: 02/26/15  5:54 AM  Result Value Ref Range   Prothrombin Time 26.1 (H) 11.6 - 15.2 seconds   INR 2.43 (H) 0.00 - 1.49  CBC     Status: Abnormal   Collection Time: 02/26/15  5:54 AM  Result Value Ref Range   WBC 21.8 (H) 4.0 - 10.5 K/uL   RBC 4.73 4.22 - 5.81 MIL/uL   Hemoglobin 14.6 13.0 - 17.0 g/dL   HCT 16.1 09.6 - 04.5 %   MCV 93.9 78.0 - 100.0 fL   MCH 30.9 26.0 - 34.0 pg   MCHC 32.9  30.0 - 36.0 g/dL   RDW 40.9 (H) 81.1 - 91.4 %   Platelets 462 (H) 150 - 400 K/uL  Basic metabolic panel     Status: Abnormal   Collection Time: 02/26/15  5:54 AM  Result Value Ref Range   Sodium 140 135 - 145 mmol/L   Potassium 3.2 (L) 3.5 - 5.1 mmol/L   Chloride 101 101 - 111 mmol/L   CO2 30 22 - 32 mmol/L   Glucose, Bld 118 (H) 65 - 99 mg/dL   BUN 18 6 - 20 mg/dL   Creatinine, Ser 7.82 0.61 - 1.24 mg/dL   Calcium 9.0 8.9 - 95.6 mg/dL   GFR calc non Af Amer >60 >60 mL/min   GFR calc Af Amer >60 >60 mL/min   Anion gap 9 5 - 15  Troponin I     Status: Abnormal   Collection Time: 02/26/15 11:35 AM  Result Value Ref Range   Troponin I 0.09 (H) <0.031 ng/mL  Magnesium     Status: None   Collection Time: 02/26/15 11:35 AM  Result Value Ref Range   Magnesium 1.9 1.7 - 2.4 mg/dL    EKG: NSR, TWI AVL, O1-H0  IMAGING: Dg Chest Port 1 View  02/26/2015  CLINICAL DATA:  Dyspnea and nausea. EXAM: PORTABLE CHEST 1 VIEW COMPARISON:  02/25/2015 FINDINGS: The patient is rotated to the right. Allowing for this, the cardiac silhouette appears mildly enlarged. The lungs remain hypoinflated with similar appearance of heterogeneous bibasilar airspace opacities. No sizable pleural effusion or pneumothorax is identified. Upper abdominal surgical clips are noted. IMPRESSION: Unchanged bibasilar lung opacities concerning for pneumonia. Electronically Signed   By: Sebastian Ache M.D.   On: 02/26/2015 10:03   Dg Chest Port 1 View  02/25/2015  CLINICAL DATA:  Chest pain and shortness of breath today. EXAM: PORTABLE CHEST 1 VIEW COMPARISON:  Chest radiograph February 23, 2015 FINDINGS: The cardiac silhouette appears mildly enlarged, mediastinal silhouette is nonsuspicious. Low inspiratory examination. Patchy RIGHT mid lung zone and bibasilar consolidation. Elevated RIGHT hemidiaphragm. No pleural effusion. No pneumothorax. Soft  tissue planes and included osseous structure nonsuspicious. Surgical clips project in the  abdomen. IMPRESSION: Worsening multi focal consolidation concerning for pneumonia. Mild cardiomegaly. Electronically Signed   By: Awilda Metroourtnay  Bloomer M.D.   On: 02/25/2015 05:24    IMPRESSION: Principal Problem:   HCAP (healthcare-associated pneumonia) Active Problems:   Sepsis due to pneumonia (HCC)   Dyspnea   Chronic anticoagulation-Coumadin   Clotting disorder (HCC)   Abnormal EKG- new TWI   S/P cerebral aneurysm coiling 2008   SAH (subarachnoid hemorrhage) 2008   Paraplegia (HCC)   Hypokalemia   RECOMMENDATION: MD to see. Consider echo for WMA but pt is asymptomatic. K+ ordered by primary service.   Time Spent Directly with Patient: 40 minutes  Corine ShelterLuke Kilroy, [place]  [phone] beeper 02/26/2015, 2:14 PM  As above, patient seen and examined. Briefly he is a 65 year old male with past medical history of subarachnoid hemorrhage leading to bilateral lower extremity weakness and inability to ambulate, protein S deficiency for evaluation of possible pulmonary edema and abnormal electrocardiogram. No prior cardiac history. Patient recently admitted with what was felt to be acute pharyngitis. Also possible pneumonia. Patient was treated with Levaquin. Chest x-ray on January 5 showed bibasilar infiltrates. He awoke on January 7 acutely short of breath. He denies chest pain, fevers, chills. He has a cough productive of blood-streaked sputum. He has been admitted and treated with antibiotics. His troponin is minimally elevated at 0.09 and his electrocardiogram shows anterior lateral T-wave inversion. Cardiology asked to evaluate. Follow-up chest x-ray again shows bibasilar airspace opacities. White blood cell count 21.8. Electrocardiogram shows sinus rhythm with anterior lateral T-wave inversion and prolonged QT interval.  Etiology of events unclear. Some of his infiltrates were present on chest x-ray at last hospitalization. He then became acutely short of breath following discharge. Question  combination of pneumonia and pulmonary edema. We will plan echocardiogram to assess LV function and wall motion. Check BNP. His minimal elevation in troponin is not diagnostic of myocardial infarction. We will continue with IV diuresis to see if this improves his pulmonary infiltrate/edema. I agree with continued antibiotics for possible pneumonia. His electrocardiogram is abnormal. Question ischemia contribution. I discussed the possibility of further ischemia evaluation with the patient. However he is a no CODE BLUE. He states his health has been poor for years and he is "ready to go". He declines any procedures including nuclear study and cath and understands the risk of undiagnosed coronary disease including myocardial infarction and death. If LV function reduced on echocardiogram we could add ACE inhibitor and beta blocker. Further recommendations to follow after echo. Olga MillersBrian Terryann Verbeek

## 2015-02-26 NOTE — Progress Notes (Signed)
Family Medicine Teaching Service Daily Progress Note Intern Pager: [phone]  [name] Medical record number: [id number] Date of birth: 1951-01-29 Age: 65 y.o. Gender: male  Primary Care Provider: MCDIARMID,TODD D, MD Consultants: none Code Status: DNR/DNI (discussed on admission)  Pt Overview and Major Events to Date:  1/7: Admitted to FPTS  Assessment and Plan:  Daniel Wolfe is a 65 y.o. male presenting with sore throat . PMH is significant for Aneurysm, Clotting disorder, Depression, Vitamin Wolfe deficiency disease, Constipation, GERD, Neuropathy, Urinary retention, Anxiety, Spinal paraplegia, Neurogenic bladder, Benign prostate hyperplasia, and Brain aneurysm.  # Multifocal pneumonia/ Sepsis: Not currently septic. Currently on Vanc and Zosyn. ABG on admission: pH 7.327,  WBC 21.8 CXR (1/7) with worsening R mid lung opacification. Cefepime x1 (1/7). - SDU for close observation of respiratory status - Telemetry - Vitals per floor protocol - O2 prn saturations <92%, 3 L Daniel Wolfe currently -Vanc and Aztreonam (1/7-  ) - Duonebs q4, Albuterol q2 prn - f/u blood cultures - CBC in am - On 3 L Daniel Wolfe currently  #Possible pulmonary edema: s/p 10 U IV Lasix. 2 L output last 24 hours - 40 mg Lasix BID - Strict I/O  # Hypokalemia: initially 2.9. S/p 3 rounds of potassium 10 meq IV  Now 3.2 - IV potassium today - BMP in am  #Hypomagnesia: low at 1.5 yesterday s/p 2 g. - Recheck Mag today  # Urinary Retention with Suprapubic Cath: Neurogenic bladder due to paraplegia. Last changed December 15th, not due for a new one. Does not appear infected on admission. - Continue to ensure cath site is clean and has good urine output - Foley care  # Protein S deficiency: Problem not in Epic but per patient history. Has had DVT in past. Is currently on Coumadin for anticoagulation.  - Continue home coumadin per pharmacy - INR - CBC in am  # Neuropathy associated with Paraplegia: has  been paraplegic since his subarachnoid hemorrhage 8 years ago. Moves extremities but unable to ambulate and utilizes a wheelchair. - Multiple repetitive medications in MAR incl Lyrica, Gabapentin, Baclofen, Seroquel, Cymbalta, Zoloft.  - Will wait for pharm med rec before ordering these.  # Social: Lives in ALF - c/s SW to assist this patient to return when stable for discharge.  FEN/GI: heart healthy diet Prophylaxis: Coumadin per pharmacy   Disposition: Continue to monitor inpatient on SDU  Subjective:  - Pt nauseas this AM, received IV phenergan - Is refusing to take PO meds due to nausea, encouraged pt to take PO meds  - No complaints of pain this AM  Objective: Temp:  [98 F (36.7 C)-99.4 F (37.4 C)] 98.4 F (36.9 C) (01/08 0414) Pulse Rate:  [76-95] 76 (01/08 0754) Resp:  [13-20] 20 (01/08 0754) BP: (101-155)/(64-133) 154/93 mmHg (01/08 0707) SpO2:  [94 %-100 %] 99 % (01/08 0754) Weight:  [217 lb (98.431 kg)] 217 lb (98.431 kg) (01/07 1427) Physical Exam: General: In NAD, laying on right side in bed Cardiovascular: RRR, normal s1 and s2, no murmurs  Respiratory: normal work of breathing, on 3 L [place], decreased breath sounds throughout with a few crackles at bases  Abdomen: soft, non tender, non distended, suprapubic cath in place draining dark yellow urine.  Extremities: no edema  Laboratory:  Recent Labs Lab 02/24/15 0739 02/25/15 0508 02/25/15 0511 02/26/15 0554  WBC 16.4*  --  18.5* 21.8*  HGB 15.5 18.7* 16.3 14.6  HCT 47.1 55.0* 50.1 44.4  PLT  405*  --  412* 462*    Recent Labs Lab 02/22/15 1430 02/25/15 0508 02/25/15 0511 02/26/15 0554  NA 143 140 141 140  K 4.1 2.9* 3.1* 3.2*  CL 111 96* 97* 101  CO2 22  --  28 30  BUN 12 16 14 18   CREATININE 0.81 1.20 1.32* 1.05  CALCIUM 8.7*  --  9.1 9.0  PROT 6.7  --  7.2  --   BILITOT 0.8  --  1.3*  --   ALKPHOS 65  --  76  --   ALT 9*  --  14*  --   AST 13*  --  23  --   GLUCOSE 88 106* 108* 118*     Imaging/Diagnostic Tests: Dg Chest Port 1 View  02/26/2015  CLINICAL DATA:  Dyspnea and nausea. EXAM: PORTABLE CHEST 1 VIEW COMPARISON:  02/25/2015 FINDINGS: The patient is rotated to the right. Allowing for this, the cardiac silhouette appears mildly enlarged. The lungs remain hypoinflated with similar appearance of heterogeneous bibasilar airspace opacities. No sizable pleural effusion or pneumothorax is identified. Upper abdominal surgical clips are noted. IMPRESSION: Unchanged bibasilar lung opacities concerning for pneumonia. Electronically Signed   By: Daniel AcheAllen  Grady M.Wolfe.   On: 02/26/2015 10:03     Daniel Dinninghristina M Gambino, MD 02/26/2015, 8:54 AM PGY-1, [place] [facility] FPTS Intern pager: [phone], text pages welcome

## 2015-02-26 NOTE — Progress Notes (Signed)
ANTIBIOTIC CONSULT NOTE -  Pharmacy Consult for Vancomycin and Aztreonam Indication: pneumonia  Allergies  Allergen Reactions  . Penicillins Other (See Comments)    Unknown on MAR  . Senna Nausea And Vomiting  . Sulfa Antibiotics Other (See Comments)    [facility]    [facility] Measurements: Height: 5\' 10"  (177.8 cm) Weight: 217 lb (98.431 kg) IBW/kg (Calculated) : 73  Vital Signs: Temp: 98.4 F (36.9 C) (01/08 0414) Temp Source: Oral (01/08 0414) BP: 154/93 mmHg (01/08 0707) Pulse Rate: 76 (01/08 0754) Intake/Output from previous day: 01/07 0701 - 01/08 0700 In: 660 [P.O.:360; IV Piggyback:300] Out: 2150 [Urine:2150] Intake/Output from this shift: Total I/O In: -  Out: 125 [Urine:125]  Labs:  Recent Labs  02/24/15 0739 02/25/15 0508 02/25/15 0511 02/26/15 0554  WBC 16.4*  --  18.5* 21.8*  HGB 15.5 18.7* 16.3 14.6  PLT 405*  --  412* 462*  CREATININE  --  1.20 1.32* 1.05   Estimated Creatinine Clearance: 85.8 mL/min (by C-G formula based on Cr of 1.05). No results for input(s): VANCOTROUGH, VANCOPEAK, VANCORANDOM, GENTTROUGH, GENTPEAK, GENTRANDOM, TOBRATROUGH, TOBRAPEAK, TOBRARND, AMIKACINPEAK, AMIKACINTROU, AMIKACIN in the last 72 hours.   Microbiology: Recent Results (from the past 720 hour(s))  Blood culture (routine x 2)     Status: None   Collection Time: 02/20/15  7:02 AM  Result Value Ref Range Status   Specimen Description BLOOD LEFT ANTECUBITAL  Final   Special Requests BOTTLES DRAWN AEROBIC AND ANAEROBIC 5ML  Final   Culture   Final    NO GROWTH 5 DAYS [facility]    Report Status 02/25/2015 FINAL  Final  Blood culture (routine x 2)     Status: None   Collection Time: 02/20/15  7:10 AM  Result Value Ref Range Status   Specimen Description BLOOD RIGHT ANTECUBITAL  Final   Special Requests BOTTLES DRAWN AEROBIC AND ANAEROBIC 5ML  Final   Culture   Final    NO GROWTH 5 DAYS Performed at [facility]    Report  Status 02/25/2015 FINAL  Final  Culture, Group A Strep     Status: None   Collection Time: 02/20/15  8:52 AM  Result Value Ref Range Status   Strep A Culture Negative  Final    Comment: (NOTE) Performed At: [facility] [address] [place], [place] [id number] Mila HomerHancock William F MD [phone]   MRSA PCR Screening     Status: Abnormal   Collection Time: 02/20/15  2:06 PM  Result Value Ref Range Status   MRSA by PCR POSITIVE (A) NEGATIVE Final    Comment:        The GeneXpert MRSA Assay (FDA approved for NASAL specimens only), is one component of a comprehensive MRSA colonization surveillance program. It is not intended to diagnose MRSA infection nor to guide or monitor treatment for MRSA infections. RESULT CALLED TO, READ BACK BY AND VERIFIED WITH: Patrick NorthK. ALEXANDER RN 16:05 02/20/15 ([email or web address])   Rapid strep screen (not at [facility])     Status: None   Collection Time: 02/20/15  5:19 PM  Result Value Ref Range Status   Streptococcus, Group A Screen (Direct) NEGATIVE NEGATIVE Final    Comment: (NOTE) A Rapid Antigen test may result negative if the antigen level in the sample is below the detection level of this test. The FDA has not cleared this test as a stand-alone test therefore the rapid antigen negative result has reflexed to a Group A  Strep culture.   Culture, Group A Strep     Status: None   Collection Time: 02/20/15  5:19 PM  Result Value Ref Range Status   Strep A Culture Negative  Final    Comment: (NOTE) Performed At: [facility] [address] [place], [place] [id number] Mila Homer MD [id number]   MRSA PCR Screening     Status: Abnormal   Collection Time: 02/25/15  2:56 PM  Result Value Ref Range Status   MRSA by PCR POSITIVE (A) NEGATIVE Final    Comment:        The GeneXpert MRSA Assay (FDA approved for NASAL specimens only), is one component of a comprehensive MRSA colonization surveillance program. It is not intended to diagnose  MRSA infection nor to guide or monitor treatment for MRSA infections. RESULT CALLED TO, READ BACK BY AND VERIFIED WITH: DOTY,S RN 02/25/15 1815 WOOTEN,K    Assessment: 65 y.o. male with SOB/hypoxia for empiric antibiotics.  Scr improving today, CrCl ~ 85 ml/min.  WBC increasing although afebrile.  1/2  Blood x 2 - Neg 1/2  GrpA strep cx - negative 1/2  Rapid Strep - negative 1/2  MRSA PCR - positive 1/7 BCx x 2 -   Rocephin 1/2 >>1/3 Azith 1/2 x1 LVQ 1/5 >>1/6 Vanc 1/7 > Aztreonam 1/7 >  Goal of Therapy:  Vancomycin trough level 15-20 mcg/ml  Plan:  1. Increase vancomycin to 1g IV q 8 hrs for now.  Will watch renal function closely and check trough level at steady state. 2. Continue aztreonam 1g IV q 8 hrs. 3. F/u cultures, clinical course.  Tad Moore, BCPS  Clinical Pharmacist Pager [phone]  02/26/2015 8:07 AM

## 2015-02-26 NOTE — Care Management Note (Addendum)
Case Management Note  Patient Details  Name: Daniel Wolfe MRN: [id number] Date of Birth: 1950-12-28  Subjective/Objective:        Admitted with respiratory distress, PNA. Resides @ [facility]. Recently admitted with PNA(1/2- 1/6).            Action/Plan: Return to home when medically stable. CM to f/u with disposition needs.  Expected Discharge Date:                  Expected Discharge Plan:  Assisted Living / Rest Home  In-House Referral:  Clinical Social Work  Discharge planning Services  CM Consult  Post Acute Care Choice:  Resumption of Svcs/PTA Provider Choice offered to:     DME Arranged:  Oxygen DME Agency:  [facility]  HH Arranged:    HH Agency:     Status of Service:  In process, will continue to follow  Medicare Important Message Given:    Date Medicare IM Given:    Medicare IM give by:    Date Additional Medicare IM Given:    Additional Medicare Important Message give by:     If discussed at Long Length of Stay Meetings, dates discussed:    Additional Comments: Georgianne FickMichael Szymborski (son) [phone]  Epifanio LeschesCole, Donnel Venuto [place], [place],BSN,CM [phone] 02/26/2015, 12:07 PM

## 2015-02-26 NOTE — Progress Notes (Signed)
ANTICOAGULATION CONSULT NOTE    Pharmacy Consult for Coumadin Indication: Protein S deficiency  Allergies  Allergen Reactions  . Penicillins Other (See Comments)    Unknown on MAR  . Senna Nausea And Vomiting  . Sulfa Antibiotics Other (See Comments)    [facility]    Patient Measurements: Height: 5\' 10"  (177.8 cm) Weight: 217 lb (98.431 kg) IBW/kg (Calculated) : 73 Vital Signs: Temp: 98.4 F (36.9 C) (01/08 0414) Temp Source: Oral (01/08 0414) BP: 154/93 mmHg (01/08 0707) Pulse Rate: 76 (01/08 0754)  Labs:  Recent Labs  02/24/15 0739 02/25/15 0508 02/25/15 0511 02/25/15 0610 02/25/15 2254 02/26/15 0554  HGB 15.5 18.7* 16.3  --   --  14.6  HCT 47.1 55.0* 50.1  --   --  44.4  PLT 405*  --  412*  --   --  462*  LABPROT 27.0*  --   --  25.0*  --  26.1*  INR 2.54*  --   --  2.30*  --  2.43*  CREATININE  --  1.20 1.32*  --   --  1.05  TROPONINI  --   --   --   --  0.09*  --     Estimated Creatinine Clearance: 85.8 mL/min (by C-G formula based on Cr of 1.05).   Medical History: Past Medical History  Diagnosis Date  . Aneurysm (HCC)   . Clotting disorder (HCC)   . Depressed   . Vitamin D deficiency disease   . Constipation   . GERD (gastroesophageal reflux disease)   . Neuropathy (HCC)   . Urinary retention   . Anxiety   . Spinal paraplegia (HCC)   . Neurogenic bladder   . Benign prostate hyperplasia   . Brain aneurysm     Assessment: 65 y.o. male readmitted with PNA, h/p DVT and Protein S deficiency, for Coumadin. Received Coumadin 3 mg 1/6 prior to discharge.  Coumadin previously held 1/2-1/5 due to supratherapeutic INR.  PTA Coumadin dose reported as 6 mg daily, but INR elevated at last admission.  1/8 > INR stable at 2.43  Goal of Therapy:  INR 2-3 Monitor platelets by anticoagulation protocol: Yes   Plan:  Coumadin 3 mg today Daily INR  Tad MooreJessica Tayler Lassen, Pharm D, BCPS  Clinical Pharmacist Pager [phone]  02/26/2015 8:20 AM

## 2015-02-27 ENCOUNTER — Inpatient Hospital Stay (HOSPITAL_COMMUNITY): Payer: Commercial Managed Care - HMO

## 2015-02-27 DIAGNOSIS — J189 Pneumonia, unspecified organism: Secondary | ICD-10-CM

## 2015-02-27 DIAGNOSIS — A419 Sepsis, unspecified organism: Principal | ICD-10-CM

## 2015-02-27 DIAGNOSIS — R0600 Dyspnea, unspecified: Secondary | ICD-10-CM

## 2015-02-27 DIAGNOSIS — Z7901 Long term (current) use of anticoagulants: Secondary | ICD-10-CM

## 2015-02-27 DIAGNOSIS — D689 Coagulation defect, unspecified: Secondary | ICD-10-CM

## 2015-02-27 DIAGNOSIS — Z8679 Personal history of other diseases of the circulatory system: Secondary | ICD-10-CM

## 2015-02-27 DIAGNOSIS — Z9889 Other specified postprocedural states: Secondary | ICD-10-CM

## 2015-02-27 LAB — BASIC METABOLIC PANEL
ANION GAP: 11 (ref 5–15)
BUN: 16 mg/dL (ref 6–20)
CALCIUM: 8.3 mg/dL — AB (ref 8.9–10.3)
CO2: 24 mmol/L (ref 22–32)
CREATININE: 0.82 mg/dL (ref 0.61–1.24)
Chloride: 103 mmol/L (ref 101–111)
GLUCOSE: 88 mg/dL (ref 65–99)
Potassium: 4.3 mmol/L (ref 3.5–5.1)
Sodium: 138 mmol/L (ref 135–145)

## 2015-02-27 LAB — CBC
HEMATOCRIT: 40.6 % (ref 39.0–52.0)
Hemoglobin: 13.2 g/dL (ref 13.0–17.0)
MCH: 30.6 pg (ref 26.0–34.0)
MCHC: 32.5 g/dL (ref 30.0–36.0)
MCV: 94.2 fL (ref 78.0–100.0)
PLATELETS: 441 10*3/uL — AB (ref 150–400)
RBC: 4.31 MIL/uL (ref 4.22–5.81)
RDW: 17.3 % — AB (ref 11.5–15.5)
WBC: 17.4 10*3/uL — AB (ref 4.0–10.5)

## 2015-02-27 LAB — BRAIN NATRIURETIC PEPTIDE: B Natriuretic Peptide: 95.4 pg/mL (ref 0.0–100.0)

## 2015-02-27 LAB — PROTIME-INR
INR: 2.17 — AB (ref 0.00–1.49)
Prothrombin Time: 24 seconds — ABNORMAL HIGH (ref 11.6–15.2)

## 2015-02-27 MED ORDER — WARFARIN SODIUM 5 MG PO TABS
5.0000 mg | ORAL_TABLET | Freq: Once | ORAL | Status: AC
  Administered 2015-02-27: 5 mg via ORAL
  Filled 2015-02-27: qty 1

## 2015-02-27 NOTE — Progress Notes (Signed)
ANTICOAGULATION CONSULT NOTE - Follow Up Consult  Pharmacy Consult for Coumadin Indication: Protein S deficiency  Allergies  Allergen Reactions  . Penicillins Other (See Comments)    Unknown on MAR  . Senna Nausea And Vomiting  . Sulfa Antibiotics Other (See Comments)    Unknown on MAR    Patient Measurements: Height: 5\' 10"  (177.8 cm) Weight: 217 lb (98.431 kg) IBW/kg (Calculated) : 73  Vital Signs: Temp: 98.3 F (36.8 C) (01/09 1436) Temp Source: Oral (01/09 1436) BP: 129/88 mmHg (01/09 1130) Pulse Rate: 88 (01/09 1130)  Labs:  Recent Labs  02/25/15 0511 02/25/15 0610 02/25/15 2254 02/26/15 0554 02/26/15 1135 02/27/15 0326  HGB 16.3  --   --  14.6  --  13.2  HCT 50.1  --   --  44.4  --  40.6  PLT 412*  --   --  462*  --  441*  LABPROT  --  25.0*  --  26.1*  --  24.0*  INR  --  2.30*  --  2.43*  --  2.17*  CREATININE 1.32*  --   --  1.05  --  0.82  TROPONINI  --   --  0.09*  --  0.09*  --     Estimated Creatinine Clearance: 109.9 mL/min (by C-G formula based on Cr of 0.82).  Assessment:   INR is therapeutic (2.17) but has trended down after 3 mg doses the last 3 days.   Home regimen: 6 mg daily, though INR was supratherapeutic last week.  Goal of Therapy:  INR 2-3 Monitor platelets by anticoagulation protocol: Yes   Plan:   Coumadin 5 mg x 1 today, to try to keep INR at goal.  Daily PT/INR.  Dennie Fettersgan, Tadao Emig [place], [place] Pager: [phone] 02/27/2015,3:01 PM

## 2015-02-27 NOTE — Progress Notes (Signed)
Family Medicine Teaching Service Daily Progress Note Intern Pager: [phone]  [name] Medical record number: [id number] Date of birth: 1950/12/30 Age: 65 y.o. Gender: male  Primary Care Provider: MCDIARMID,TODD D, MD Consultants: none Code Status: DNR/DNI (discussed on admission)  Pt Overview and Major Events to Date:  1/7: Admitted to FPTS  Assessment and Plan:  Daniel LenisRoy D Lavergne is a 65 y.o. male presenting with sore throat . PMH is significant for Aneurysm, Clotting disorder, Depression, Vitamin D deficiency disease, Constipation, GERD, Neuropathy, Urinary retention, Anxiety, Spinal paraplegia, Neurogenic bladder, Benign prostate hyperplasia, and Brain aneurysm.  # Multifocal pneumonia/ Sepsis: Not currently septic. Currently on Vanc and Zosyn. ABG on admission: pH 7.327, WBC 21.8>>17.4 CXR (1/7) with worsening R mid lung opacification. Cefepime x1 (1/7). - Transfer out of SDU and to med surg, respiratory status has improved -  DC Telemetry - Vitals per floor protocol - O2 prn saturations <92%, 2 L [place] currently -Vanc and Aztreonam (1/7-  ), will need to transition to PO abx. Has failed outpatient Levaquin.  - Duonebs q4, Albuterol q2 prn - f/u blood cultures - CBC in am - On 2 L [place] currently  New t-wave inversions on ECG and elevated troponin: ECG changes on 1/8. Trop 0.09 x 2. BNP normal.  - Cardiology consulted, appreciate their assistance - Cardio recs: Echocardiogram today  #Possible pulmonary edema: s/p 10 U IV Lasix. Down 1.8 L since admission - DC Lasix BID - Strict I/O   # Hypokalemia: Resolved. K 4.3 - BMP in am  #Hypomagnesia: Resolved  # Urinary Retention with Suprapubic Cath: Neurogenic bladder due to paraplegia. Last changed December 15th, not due for a new one. Does not appear infected on admission. - Continue to ensure cath site is clean and has good urine output - Foley care  # Protein S deficiency: Problem not in Epic but per patient  history. Has had DVT in past. Is currently on Coumadin for anticoagulation.  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 Moves extremities but unable to ambulate and utilizes a wheelchair. - Multiple repetitive medications in MAR incl Lyrica, Gabapentin, Baclofen, Seroquel, Cymbalta, Zoloft.  - Will wait for pharm med rec before ordering these. - Contact ALF ([facility]) to see what medications he has been getting  # Social: Lives in ALF - c/s SW to assist this patient to return when stable for discharge.  FEN/GI: heart healthy diet Prophylaxis: Coumadin per pharmacy   Disposition: ALF after transitioning to PO abx and stable from cardiology standpoint  Subjective:  - No complaints of nausea or vomiting - Still no appetite, apparently this is patient's baseline. He states most food doesn't sit well with his stomach. He typically eats honey buns and popcorn.  - No complaints of pain this AM - No SOB, chest pain, or palpitations  Objective: Temp:  [97.8 F (36.6 C)-98.8 F (37.1 C)] 98.8 F (37.1 C) (01/09 0333) Pulse Rate:  [75-86] 86 (01/09 0335) Resp:  [15-20] 20 (01/09 0335) BP: (106-136)/(67-101) 128/80 mmHg (01/09 0335) SpO2:  [93 %-99 %] 93 % (01/09 0335) Physical Exam: General: In NAD, laying on right side in bed Cardiovascular: RRR, normal s1 and s2, no murmurs  Respiratory: normal work of breathing, on 2 L [place], decreased breath sounds throughout but improved with a few crackles at bases  Abdomen: soft, non tender, non distended, suprapubic  cath in place draining dark yellow urine.  Extremities: no edema  Laboratory:  Recent Labs Lab 02/25/15 0511 02/26/15 0554 02/27/15 0326  WBC 18.5* 21.8* 17.4*  HGB 16.3 14.6 13.2  HCT 50.1 44.4 40.6  PLT 412* 462* 441*    Recent Labs Lab 02/22/15 1430  02/25/15 0511 02/26/15 0554 02/27/15 0326   NA 143  < > 141 140 138  K 4.1  < > 3.1* 3.2* 4.3  CL 111  < > 97* 101 103  CO2 22  --  28 30 24   BUN 12  < > 14 18 16   CREATININE 0.81  < > 1.32* 1.05 0.82  CALCIUM 8.7*  --  9.1 9.0 8.3*  PROT 6.7  --  7.2  --   --   BILITOT 0.8  --  1.3*  --   --   ALKPHOS 65  --  76  --   --   ALT 9*  --  14*  --   --   AST 13*  --  23  --   --   GLUCOSE 88  < > 108* 118* 88  < > = values in this interval not displayed.  Imaging/Diagnostic Tests: Dg Chest Port 1 View  02/26/2015  CLINICAL DATA:  Dyspnea and nausea. EXAM: PORTABLE CHEST 1 VIEW COMPARISON:  02/25/2015 FINDINGS: The patient is rotated to the right. Allowing for this, the cardiac silhouette appears mildly enlarged. The lungs remain hypoinflated with similar appearance of heterogeneous bibasilar airspace opacities. No sizable pleural effusion or pneumothorax is identified. Upper abdominal surgical clips are noted. IMPRESSION: Unchanged bibasilar lung opacities concerning for pneumonia. Electronically Signed   By: Sebastian Ache M.D.   On: 02/26/2015 10:03     [name], MD 02/27/2015, 7:30 AM PGY-1, [facility] FPTS Intern pager: [phone], text pages welcome

## 2015-02-27 NOTE — Progress Notes (Signed)
NURSING PROGRESS NOTE  Daniel Wolfe [id number] Transfer Data: 02/27/2015 5:10 PM Attending Provider: Tobey GrimJeffrey H Walden, MD WUJ:[id number],NFAOPCP:MCDIARMID,TODD D, MD Code Status: DNR  Daniel Wolfe is a 65 y.o. male [facility] [address] [facility]  -No acute distress noted.  -No complaints of shortness of breath.  -No complaints of chest pain.     Blood pressure 148/94, pulse 88, temperature 98.3 F (36.8 C), temperature source Oral, resp. rate 17, height 5\' 10"  (1.778 m), weight 98.431 kg (217 lb), SpO2 98 %.   IV Fluids:  IV in place, SL.  Allergies:  Penicillins; Senna; and Sulfa antibiotics  Past Medical History:   has a past medical history of Clotting disorder (HCC); Depressed; Vitamin D deficiency disease; Constipation; GERD (gastroesophageal reflux disease); Neuropathy (HCC); Urinary retention; Anxiety; Spinal paraplegia (HCC) (2008); Neurogenic bladder; Benign prostate hyperplasia; Brain aneurysm (2008); and Subarachnoid hemorrhage (HCC) (2008).  Past Surgical History:   has past surgical history that includes Cholecystectomy and Splenectomy.  Social History:   reports that he has never smoked. He has never used smokeless tobacco. He reports that he does not drink alcohol or use illicit drugs.  Skin: Intact  Patient/Family orientated to room. Information packet given to patient/family. Admission inpatient armband information verified with patient/family to include name and date of birth and placed on patient arm. Side rails up x 2, fall assessment and education completed with patient/family. Patient/family able to verbalize understanding of risk associated with falls and verbalized understanding to call for assistance before getting out of bed. Call light within reach. Patient/family able to voice and demonstrate understanding of unit orientation instructions.    Will continue to evaluate and treat per MD orders.

## 2015-02-27 NOTE — Progress Notes (Signed)
  Echocardiogram 2D Echocardiogram has been performed.  Daniel SavoyCasey N Raynard Wolfe 02/27/2015, 11:57 AM

## 2015-02-27 NOTE — Progress Notes (Signed)
Subjective:  Pt feeling clinically improved  Objective:  Temp:  [97.8 F (36.6 C)-98.8 F (37.1 C)] 98.1 F (36.7 C) (01/09 0748) Pulse Rate:  [75-86] 83 (01/09 0748) Resp:  [15-22] 22 (01/09 0748) BP: (106-136)/(67-101) 134/86 mmHg (01/09 0748) SpO2:  [93 %-98 %] 95 % (01/09 0748) Weight change:   Intake/Output from previous day: 01/08 0701 - 01/09 0700 In: 400 [IV Piggyback:400] Out: 775 [Urine:775]  Intake/Output from this shift: Total I/O In: -  Out: 150 [Urine:150]  Physical Exam: General appearance: alert and no distress Neck: no adenopathy, no carotid bruit, no JVD, supple, symmetrical, trachea midline and thyroid not enlarged, symmetric, no tenderness/mass/nodules Lungs: crackles bilat Heart: regular rate and rhythm, S1, S2 normal, no murmur, click, rub or gallop Extremities: extremities normal, atraumatic, no cyanosis or edema  Lab Results: Results for orders placed or performed during the hospital encounter of 02/25/15 (from the past 48 hour(s))  Lactic acid, plasma     Status: None   Collection Time: 02/25/15  1:00 PM  Result Value Ref Range   Lactic Acid, Venous 1.5 0.5 - 2.0 mmol/L  MRSA PCR Screening     Status: Abnormal   Collection Time: 02/25/15  2:56 PM  Result Value Ref Range   MRSA by PCR POSITIVE (A) NEGATIVE    Comment:        The GeneXpert MRSA Assay (FDA approved for NASAL specimens only), is one component of a comprehensive MRSA colonization surveillance program. It is not intended to diagnose MRSA infection nor to guide or monitor treatment for MRSA infections. RESULT CALLED TO, READ BACK BY AND VERIFIED WITH: DOTY,S RN 02/25/15 1815 WOOTEN,K   Troponin I     Status: Abnormal   Collection Time: 02/25/15 10:54 PM  Result Value Ref Range   Troponin I 0.09 (H) <0.031 ng/mL    Comment:        PERSISTENTLY INCREASED TROPONIN VALUES IN THE RANGE OF 0.04-0.49 ng/mL CAN BE SEEN IN:       -UNSTABLE ANGINA       -CONGESTIVE HEART  FAILURE       -MYOCARDITIS       -CHEST TRAUMA       -ARRYHTHMIAS       -LATE PRESENTING MYOCARDIAL INFARCTION       -COPD   CLINICAL FOLLOW-UP RECOMMENDED.   Protime-INR     Status: Abnormal   Collection Time: 02/26/15  5:54 AM  Result Value Ref Range   Prothrombin Time 26.1 (H) 11.6 - 15.2 seconds   INR 2.43 (H) 0.00 - 1.49  CBC     Status: Abnormal   Collection Time: 02/26/15  5:54 AM  Result Value Ref Range   WBC 21.8 (H) 4.0 - 10.5 K/uL   RBC 4.73 4.22 - 5.81 MIL/uL   Hemoglobin 14.6 13.0 - 17.0 g/dL   HCT 44.4 39.0 - 52.0 %   MCV 93.9 78.0 - 100.0 fL   MCH 30.9 26.0 - 34.0 pg   MCHC 32.9 30.0 - 36.0 g/dL   RDW 17.3 (H) 11.5 - 15.5 %   Platelets 462 (H) 150 - 400 K/uL  Basic metabolic panel     Status: Abnormal   Collection Time: 02/26/15  5:54 AM  Result Value Ref Range   Sodium 140 135 - 145 mmol/L   Potassium 3.2 (L) 3.5 - 5.1 mmol/L   Chloride 101 101 - 111 mmol/L   CO2 30 22 - 32 mmol/L   Glucose, Bld  118 (H) 65 - 99 mg/dL   BUN 18 6 - 20 mg/dL   Creatinine, Ser 1.05 0.61 - 1.24 mg/dL   Calcium 9.0 8.9 - 10.3 mg/dL   GFR calc non Af Amer >60 >60 mL/min   GFR calc Af Amer >60 >60 mL/min    Comment: (NOTE) The eGFR has been calculated using the CKD EPI equation. This calculation has not been validated in all clinical situations. eGFR's persistently <60 mL/min signify possible Chronic Kidney Disease.    Anion gap 9 5 - 15  Troponin I     Status: Abnormal   Collection Time: 02/26/15 11:35 AM  Result Value Ref Range   Troponin I 0.09 (H) <0.031 ng/mL    Comment:        PERSISTENTLY INCREASED TROPONIN VALUES IN THE RANGE OF 0.04-0.49 ng/mL CAN BE SEEN IN:       -UNSTABLE ANGINA       -CONGESTIVE HEART FAILURE       -MYOCARDITIS       -CHEST TRAUMA       -ARRYHTHMIAS       -LATE PRESENTING MYOCARDIAL INFARCTION       -COPD   CLINICAL FOLLOW-UP RECOMMENDED.   Magnesium     Status: None   Collection Time: 02/26/15 11:35 AM  Result Value Ref Range    Magnesium 1.9 1.7 - 2.4 mg/dL  Protime-INR     Status: Abnormal   Collection Time: 02/27/15  3:26 AM  Result Value Ref Range   Prothrombin Time 24.0 (H) 11.6 - 15.2 seconds   INR 2.17 (H) 0.00 - 1.49  CBC     Status: Abnormal   Collection Time: 02/27/15  3:26 AM  Result Value Ref Range   WBC 17.4 (H) 4.0 - 10.5 K/uL   RBC 4.31 4.22 - 5.81 MIL/uL   Hemoglobin 13.2 13.0 - 17.0 g/dL   HCT 40.6 39.0 - 52.0 %   MCV 94.2 78.0 - 100.0 fL   MCH 30.6 26.0 - 34.0 pg   MCHC 32.5 30.0 - 36.0 g/dL   RDW 17.3 (H) 11.5 - 15.5 %   Platelets 441 (H) 150 - 400 K/uL  Basic metabolic panel     Status: Abnormal   Collection Time: 02/27/15  3:26 AM  Result Value Ref Range   Sodium 138 135 - 145 mmol/L   Potassium 4.3 3.5 - 5.1 mmol/L    Comment: DELTA CHECK NOTED   Chloride 103 101 - 111 mmol/L   CO2 24 22 - 32 mmol/L   Glucose, Bld 88 65 - 99 mg/dL   BUN 16 6 - 20 mg/dL   Creatinine, Ser 0.82 0.61 - 1.24 mg/dL   Calcium 8.3 (L) 8.9 - 10.3 mg/dL   GFR calc non Af Amer >60 >60 mL/min   GFR calc Af Amer >60 >60 mL/min    Comment: (NOTE) The eGFR has been calculated using the CKD EPI equation. This calculation has not been validated in all clinical situations. eGFR's persistently <60 mL/min signify possible Chronic Kidney Disease.    Anion gap 11 5 - 15  Brain natriuretic peptide     Status: None   Collection Time: 02/27/15  3:26 AM  Result Value Ref Range   B Natriuretic Peptide 95.4 0.0 - 100.0 pg/mL    Imaging: Imaging results have been reviewed Bilateral infiltrates c/w PNA  Assessment/Plan:   1. Principal Problem: 2.   HCAP (healthcare-associated pneumonia) 3. Active Problems: 4.   S/P cerebral aneurysm  coiling 2008 5.   SAH (subarachnoid hemorrhage) 2008 6.   Paraplegia ([place]) 7.   Sepsis due to pneumonia ([place]) 8.   Dyspnea 9.   Chronic anticoagulation-Coumadin 10.   Clotting disorder ([place]) 11.   Abnormal EKG- new TWI 12.   Hypokalemia 13.   Time Spent Directly with  Patient:  20 minutes  Length of Stay:  LOS: 2 days   Pt admitted with respiratory insuff/ PNA. We were asked to see for borderline + trop and abn EKG. He is DNR and doesn't want any invasive procedures. BNP low. 2D pending. No further w/u required. Will S/O. Call if we can be of further assistance.  Quay Burow 02/27/2015, 10:29 AM

## 2015-02-28 LAB — BASIC METABOLIC PANEL
Anion gap: 7 (ref 5–15)
BUN: 9 mg/dL (ref 6–20)
CALCIUM: 8.1 mg/dL — AB (ref 8.9–10.3)
CO2: 28 mmol/L (ref 22–32)
CREATININE: 0.81 mg/dL (ref 0.61–1.24)
Chloride: 102 mmol/L (ref 101–111)
GFR calc non Af Amer: >60 mL/min (ref >60)
GLUCOSE: 106 mg/dL — AB (ref 65–99)
Potassium: 3 mmol/L — ABNORMAL LOW (ref 3.5–5.1)
Sodium: 137 mmol/L (ref 135–145)

## 2015-02-28 LAB — CBC
HEMATOCRIT: 43.1 % (ref 39.0–52.0)
Hemoglobin: 14 g/dL (ref 13.0–17.0)
MCH: 30.7 pg (ref 26.0–34.0)
MCHC: 32.5 g/dL (ref 30.0–36.0)
MCV: 94.5 fL (ref 78.0–100.0)
Platelets: 463 10*3/uL — ABNORMAL HIGH (ref 150–400)
RBC: 4.56 MIL/uL (ref 4.22–5.81)
RDW: 17 % — AB (ref 11.5–15.5)
WBC: 15.4 10*3/uL — AB (ref 4.0–10.5)

## 2015-02-28 LAB — PROTIME-INR
INR: 2.9 — AB (ref 0.00–1.49)
Prothrombin Time: 29.8 seconds — ABNORMAL HIGH (ref 11.6–15.2)

## 2015-02-28 LAB — MAGNESIUM: Magnesium: 1.9 mg/dL (ref 1.7–2.4)

## 2015-02-28 MED ORDER — POTASSIUM CHLORIDE CRYS ER 20 MEQ PO TBCR
40.0000 meq | EXTENDED_RELEASE_TABLET | Freq: Two times a day (BID) | ORAL | Status: AC
  Administered 2015-02-28 (×2): 40 meq via ORAL
  Filled 2015-02-28 (×2): qty 2

## 2015-02-28 MED ORDER — WARFARIN SODIUM 3 MG PO TABS
3.0000 mg | ORAL_TABLET | Freq: Once | ORAL | Status: AC
  Administered 2015-02-28: 3 mg via ORAL
  Filled 2015-02-28: qty 1

## 2015-02-28 MED ORDER — POTASSIUM CHLORIDE CRYS ER 20 MEQ PO TBCR: 20.0000 meq | EXTENDED_RELEASE_TABLET | Freq: Every day | ORAL | Status: DC

## 2015-02-28 MED ORDER — GUAIFENESIN ER 600 MG PO TB12
600.0000 mg | ORAL_TABLET | Freq: Two times a day (BID) | ORAL | Status: DC
  Administered 2015-02-28 – 2015-03-01 (×3): 600 mg via ORAL
  Filled 2015-02-28 (×3): qty 1

## 2015-02-28 MED ORDER — LEVOFLOXACIN 750 MG PO TABS
750.0000 mg | ORAL_TABLET | Freq: Every day | ORAL | Status: DC
  Administered 2015-02-28 – 2015-03-01 (×2): 750 mg via ORAL
  Filled 2015-02-28 (×2): qty 1

## 2015-02-28 MED ORDER — FUROSEMIDE 20 MG PO TABS
20.0000 mg | ORAL_TABLET | ORAL | Status: DC
  Administered 2015-03-01: 20 mg via ORAL
  Filled 2015-02-28: qty 1

## 2015-02-28 MED ORDER — FUROSEMIDE 10 MG/ML IJ SOLN
40.0000 mg | Freq: Once | INTRAMUSCULAR | Status: AC
  Administered 2015-02-28: 40 mg via INTRAVENOUS
  Filled 2015-02-28: qty 4

## 2015-02-28 NOTE — Discharge Summary (Signed)
Family Medicine Teaching [facility] Discharge Summary  [name] Medical record number: [id number] Date of birth: Oct 31, 1950 Age: 65 y.o. Gender: male Date of Admission: 02/25/2015  Date of Discharge: 03/01/15 Admitting Physician: Tobey Grim, MD  Primary Care Provider: MCDIARMID,TODD D, MD Consultants: none  Indication for Hospitalization: respiratory failure  Discharge Diagnoses/Problem List:  Patient Active Problem List   Diagnosis Date Noted  . Chronic anticoagulation-Coumadin 02/26/2015  . Clotting disorder (HCC) 02/26/2015  . Abnormal EKG- new TWI 02/26/2015  . Hypokalemia 02/26/2015  . Dyspnea   . Sepsis due to pneumonia (HCC) 02/25/2015  . Respiratory distress   . HCAP (healthcare-associated pneumonia) 02/23/2015  . Oxygen desaturation   . Acute pulmonary edema (HCC)   . Pneumonia   . Epiglottitis 02/20/2015  . Acute pharyngitis 02/20/2015  . Sore throat 02/20/2015  . S/P cerebral aneurysm coiling 2008 08/23/2013  . SAH (subarachnoid hemorrhage) 2008 08/23/2013  . Paraplegia (HCC) 08/23/2013    Disposition: ALF  Discharge Condition: stable  Discharge Exam: see previous progress note  Brief Hospital Course:  Daniel Wolfe is a 65 y.o. male who presented with multifocal pneumonia and sepis. PMH is significant for Aneurysm, Clotting disorder, Depression, Vitamin D deficiency disease, Constipation, GERD, Neuropathy, Urinary retention, Anxiety, Spinal paraplegia, Neurogenic bladder, Benign prostate hyperplasia, and Brain aneurysm.  Multifocal pneumonia with respiratory distress:  Pt had recent hospitalization for sore throat and developed hospital-acquired pneumonia and pulmonary edema (hospitalization was from 1/2-1/6). He presented on 1/7 with one-day history of increasing respiratory distress. Brought to the emergency department and had chest x-ray which was read as worsening multifocal pneumonia. Started on broad-spectrum antibiotics  (Vancomycin and Aztreonam). ABG revealed hypercapnic respiratory failure with hypoxia. He was started on BiPAP. Initial lactic acid was 3.5 but went down to 1.7. Pro-calcitonin 0.29. CBC showed elevated WBC of 21.8. Pt was admitted to inpatient and continued on BiPap and broad spectrum antibiotics. Patient was weaned to O2 by nasal canula and prior to discharge his respiratory status improved. Additionally, leukocytosis improved. Prior to discharge he was weaned to room air with O2 saturations in high 90's.   Antibiotics course: Vancomycin and Aztreonam (1/7 - 1/10) Levaquin started on 1/10   Possible pulmonary edema:  There were some concerns that patient's CXR showed some fluid overload. On exam, he had no signs of fluid overload other than some bilateral crackles in lungs when auscultated. Pt was given Lasix which helped his feelings of dyspnea. BNP at upper limit of normal at 95.4. Strict I/O were taken and pt had good urine output.  Cardiology was consulted. Echocardiogram revealed EF 55-60%, grade 1 diastolic dysfunction. RIGHT ventricular sytolic function was moderately reduced. Pt did not wish to have any further cardiac work up, cardiology signed off.  Upon discharge, patient was sent home with Lasix 20 mg 3 times weekly (MWF).    Hypokalemia and Hypomagnesia:  On admission, mag was low at 1.5, he recieved 2 g of mag and it normalized.  On admission, K was low at 2.9. Pt received 3 rounds of potassium 10 meq IVand K improved to 3.2. It decreased again to 3 and K was repleted. Upon discharge, K was 4. Pt will be sent home with 20 meq of KDUR daily.   All other chronic medical conditions stable throughout admission and managed with home regimens.   Issues for Follow Up:  1. Multifocal pneumonia: Continue Levaquin for 5 day course (02/28/15- 03/05/15) 2. Pulmonary edema: Echocardiogram showed some diastolic  dysfunction and right ventricular dysfunction. Pt expressed disinterest in  investigating further cardiologic work up. Will send home on Lasix 20 mg three times weekly (MWF) and KDUR 20 meq daily. Will need PCP follow up for management of these medications.  3. Oxygen requirement: patient weaned to room air on day of discharge. Will need to check oxygen status at follow up appointment.  4. Coumadin: 3 mg daily until complete Levaquin course (1/15). Please check INR on Friday (1/13) and adjust Coumadin if needed.   Significant Procedures: none  Significant Labs and Imaging:   Recent Labs Lab 02/26/15 0554 02/27/15 0326 02/28/15 0540  WBC 21.8* 17.4* 15.4*  HGB 14.6 13.2 14.0  HCT 44.4 40.6 43.1  PLT 462* 441* 463*    Recent Labs Lab 02/22/15 1430  02/25/15 0511 02/25/15 1018 02/26/15 0554 02/26/15 1135 02/27/15 0326 02/28/15 0540 03/01/15 0520  NA 143  < > 141  --  140  --  138 137 140  K 4.1  < > 3.1*  --  3.2*  --  4.3 3.0* 4.0  CL 111  < > 97*  --  101  --  103 102 101  CO2 22  --  28  --  30  --  24 28 24   GLUCOSE 88  < > 108*  --  118*  --  88 106* 88  BUN 12  < > 14  --  18  --  16 9 11   CREATININE 0.81  < > 1.32*  --  1.05  --  0.82 0.81 0.84  CALCIUM 8.7*  --  9.1  --  9.0  --  8.3* 8.1* 9.0  MG  --   --   --  1.5*  --  1.9  --  1.9  --   ALKPHOS 65  --  76  --   --   --   --   --   --   AST 13*  --  23  --   --   --   --   --   --   ALT 9*  --  14*  --   --   --   --   --   --   ALBUMIN 2.8*  --  3.2*  --   --   --   --   --   --   < > = values in this interval not displayed.  Dg Chest Port 1 View  02/26/2015  CLINICAL DATA:  Dyspnea and nausea. EXAM: PORTABLE CHEST 1 VIEW COMPARISON:  02/25/2015 FINDINGS: The patient is rotated to the right. Allowing for this, the cardiac silhouette appears mildly enlarged. The lungs remain hypoinflated with similar appearance of heterogeneous bibasilar airspace opacities. No sizable pleural effusion or pneumothorax is identified. Upper abdominal surgical clips are noted. IMPRESSION: Unchanged bibasilar  lung opacities concerning for pneumonia. Electronically Signed   By: Sebastian Ache M.D.   On: 02/26/2015 10:03   Dg Chest Port 1 View  02/25/2015  CLINICAL DATA:  Chest pain and shortness of breath today. EXAM: PORTABLE CHEST 1 VIEW COMPARISON:  Chest radiograph February 23, 2015 FINDINGS: The cardiac silhouette appears mildly enlarged, mediastinal silhouette is nonsuspicious. Low inspiratory examination. Patchy RIGHT mid lung zone and bibasilar consolidation. Elevated RIGHT hemidiaphragm. No pleural effusion. No pneumothorax. Soft tissue planes and included osseous structure nonsuspicious. Surgical clips project in the abdomen. IMPRESSION: Worsening multi focal consolidation concerning for pneumonia. Mild cardiomegaly. Electronically Signed   By: Pernell Dupre  Bloomer M.D.   On: 02/25/2015 05:24     Results/Tests Pending at Time of Discharge: none  Discharge Medications:    Medication List    STOP taking these medications        acetaminophen 325 MG tablet  Commonly known as:  TYLENOL     azithromycin 250 MG tablet  Commonly known as:  ZITHROMAX     gabapentin 600 MG tablet  Commonly known as:  NEURONTIN     phenol 1.4 % Liqd  Commonly known as:  CHLORASEPTIC     sertraline 100 MG tablet  Commonly known as:  ZOLOFT      TAKE these medications        baclofen 10 MG tablet  Commonly known as:  LIORESAL  Take 5 mg by mouth daily.     bisacodyl 5 MG EC tablet  Commonly known as:  DULCOLAX  Take 5 mg by mouth daily.     Cranberry 250 MG Caps  Take 1 capsule by mouth 2 (two) times daily.     diazepam 5 MG tablet  Commonly known as:  VALIUM  Take 5 mg by mouth 2 (two) times daily.     DULoxetine 30 MG capsule  Commonly known as:  CYMBALTA  Take 30 mg by mouth 3 (three) times daily.     furosemide 20 MG tablet  Commonly known as:  LASIX  Take 1 tablet (20 mg total) by mouth 3 (three) times a week.     levofloxacin 750 MG tablet  Commonly known as:  LEVAQUIN  Take 1 tablet  (750 mg total) by mouth daily.     loperamide 2 MG tablet  Commonly known as:  IMODIUM A-D  Take 2 mg by mouth as needed for diarrhea or loose stools.     LORazepam 0.5 MG tablet  Commonly known as:  ATIVAN  Take 0.5 mg by mouth daily as needed for anxiety (anxiety).     LYRICA 75 MG capsule  Generic drug:  pregabalin  Take 75 mg by mouth 2 (two) times daily.     mirabegron ER 50 MG Tb24 tablet  Commonly known as:  MYRBETRIQ  Take 50 mg by mouth daily.     mupirocin ointment 2 %  Commonly known as:  BACTROBAN  Place 1 application into the nose 3 (three) times daily as needed (to supra pubic area to prevent infection).     ondansetron 4 MG disintegrating tablet  Commonly known as:  ZOFRAN-ODT  Take 4 mg by mouth every 6 (six) hours as needed for nausea or vomiting.     POLYETHYLENE GLYCOL 3350 PO  Take 17 g/mL by mouth daily.     potassium chloride SA 20 MEQ tablet  Commonly known as:  K-DUR,KLOR-CON  Take 1 tablet (20 mEq total) by mouth daily.     QUEtiapine 50 MG tablet  Commonly known as:  SEROQUEL  Take 50-100 mg by mouth 2 (two) times daily. 50 mg in the am and 100 mg at bedtime.     ranitidine 150 MG tablet  Commonly known as:  ZANTAC  Take 150 mg by mouth 2 (two) times daily.     tamsulosin 0.4 MG Caps capsule  Commonly known as:  FLOMAX  Take 0.4 mg by mouth at bedtime.     traMADol 50 MG tablet  Commonly known as:  ULTRAM  Take 50 mg by mouth every 6 (six) hours as needed for moderate pain (pain).  warfarin 6 MG tablet  Commonly known as:  COUMADIN  Take 0.5 tablets (3 mg total) by mouth daily.        Discharge Instructions: Please refer to Patient Instructions section of EMR for full details.  Patient was counseled important signs and symptoms that should prompt return to medical care, changes in medications, dietary instructions, activity restrictions, and follow up appointments.   Follow-Up Appointments:   Follow-up Information    Follow up  with Arnette Felts. Schedule an appointment as soon as possible for a visit in 3 days.   Specialty:  General Practice   Why:  hospital follow up   Contact information:   [address] [place] [address] [phone]        [name], MD 02/28/2015, 8:09 AM PGY-1, [facility]

## 2015-02-28 NOTE — Progress Notes (Signed)
ANTICOAGULATION CONSULT NOTE - Follow Up Consult  Pharmacy Consult for Coumadin Indication: Protein S deficiency and hx DVT  Allergies  Allergen Reactions  . Penicillins Other (See Comments)    Unknown on MAR  . Senna Nausea And Vomiting  . Sulfa Antibiotics Other (See Comments)    [facility]    Patient Measurements: Height: 5\' 10"  (177.8 cm) Weight: 215 lb 9.6 oz (97.796 kg) IBW/kg (Calculated) : 73  Vital Signs: Temp: 98.1 F (36.7 C) (01/10 1212) Temp Source: Oral (01/10 1212) BP: 132/78 mmHg (01/10 1212) Pulse Rate: 86 (01/10 1212)  Labs:  Recent Labs  02/25/15 2254  02/26/15 0554 02/26/15 1135 02/27/15 0326 02/28/15 0540  HGB  --   < > 14.6  --  13.2 14.0  HCT  --   --  44.4  --  40.6 43.1  PLT  --   --  462*  --  441* 463*  LABPROT  --   --  26.1*  --  24.0* 29.8*  INR  --   --  2.43*  --  2.17* 2.90*  CREATININE  --   --  1.05  --  0.82 0.81  TROPONINI 0.09*  --   --  0.09*  --   --   < > = values in this interval not displayed.  Estimated Creatinine Clearance: 110.9 mL/min (by C-G formula based on Cr of 0.81).  Assessment: 65 yo M on Coumadin PTA for hx protein S deficiency and hx DVT.  INR with relatively big jump 2.17 > 2.9 in response to increased Coumadin dose.  No bleeding noted.  Pt also started on Levaquin PO today which could elevate INR.  Will reduce dose tonight.  Home regimen: 6 mg daily, though INR was supratherapeutic last week.  Goal of Therapy:  INR 2-3 Monitor platelets by anticoagulation protocol: Yes   Plan:  Coumadin 3 mg x 1 today  Daily PT/INR.  Toys 'R' UsKimberly Johnson Arizola, Pharm.D., BCPS Clinical Pharmacist Pager [phone] 02/28/2015 3:53 PM

## 2015-02-28 NOTE — Progress Notes (Signed)
Family Medicine Teaching Service Daily Progress Note Intern Pager: [phone]  [name] Medical record number: [id number] Date of birth: Jun 17, 1950 Age: 65 y.o. Gender: male  Primary Care Provider: MCDIARMID,TODD D, MD Consultants: none Code Status: DNR/DNI (discussed on admission)  Pt Overview and Major Events to Date:  1/7: Admitted to FPTS  Assessment and Plan:  Daniel Wolfe is a 65 y.o. male presenting with multifocal pneumonia and possible pulmonary edema. PMH is significant for Aneurysm, Clotting disorder, Depression, Vitamin D deficiency disease, Constipation, GERD, Neuropathy, Urinary retention, Anxiety, Spinal paraplegia, Neurogenic bladder, Benign prostate hyperplasia, and Brain aneurysm.  # Multifocal pneumonia/ Sepsis: Not currently septic. Currently on Vanc and Zosyn. ABG on admission: pH 7.327, WBC 21.8>>17.4>>15.4 CXR (1/7) with worsening R mid lung opacification. Cefepime x1 (1/7). Blood cx NG x 2 days - Vitals per floor protocol - O2 prn saturations <92%, 1 L [place] currently -Vanc and Aztreonam (1/7-1/10) ,transition to Levaquin 750 mg today for 5 day course.  - CBC in am  New t-wave inversions on ECG and elevated troponin: No cardiac hx. ECG changes on 1/8. Trop 0.09 x 2. BNP normal. Echocardiogram: EF 55-60%, grade 1 diastolic dysfunction. Right ventricular systolic function was moderately reduced.  - Cardiology was consulted, have signed off - Pt told cardiology he did not want any more invasive work up  #Possible pulmonary edema: s/p 10 U IV Lasix. Down 1.1 L since admission. Still has crackles on lungs with some mild shortness of breath - IV Lasix 40 mg x1 - Strict I/O  - Will DC on 20 mg Lasix three times weekly (MWF)   # Hypokalemia: Resolved yesterday, back down again today at 3 - KDUR 40 meq BID  - BMP in am - Will DC on 20 meq KDUR daily  #Hypomagnesia: Resolved - Will recheck mag today as patient had hypokalemia  # Urinary Retention  with Suprapubic Cath: Neurogenic bladder due to paraplegia. Last changed December 15th, not due for a new one. Does not appear infected. - Continue to ensure cath site is clean and has good urine output - Foley care  # Protein S deficiency: Problem not in Epic but per patient history. Has had DVT in past. Is currently on Coumadin for anticoagulation.  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 Moves extremities but unable to ambulate and utilizes a wheelchair. - Multiple repetitive medications in MAR incl Lyrica, Gabapentin, Baclofen, Seroquel, Cymbalta, Zoloft.  - Will wait for pharm med rec before ordering these. - Attempted to contact ALF ([facility]) to see what medications he has been getting yesterday but no answer, will try again today  # Social: Lives in ALF - c/s SW to assist this patient to return when stable for discharge.  FEN/GI: heart healthy diet Prophylaxis: Coumadin per pharmacy   Disposition: ALF possible today or tomorrow  Subjective:  - Afebrile overnight and this morning.  - No complaints of nausea or vomiting - Still has poor appetite, apparently this is patient's baseline. He states most food doesn't sit well with his stomach. He typically eats honey buns and popcorn. Concerned this on telephone call with son. - No complaints of pain this AM - Has some shortness of breath and cough with productive white sputum  Objective: Temp:  [98.1 F (36.7 C)-99.2 F (37.3 C)] 98.2 F (36.8 C) (01/10 0549) Pulse Rate:  [83-88]  84 (01/10 0549) Resp:  [17-22] 18 (01/10 0549) BP: (129-150)/(80-94) 131/85 mmHg (01/10 0549) SpO2:  [94 %-98 %] 95 % (01/10 0549) Weight:  [215 lb 9.6 oz (97.796 kg)] 215 lb 9.6 oz (97.796 kg) (01/09 1718) Physical Exam: General: In NAD, laying on right side in bed Cardiovascular: RRR, normal s1 and s2, no  murmurs  Respiratory: normal work of breathing, on 1 L , crackles present in bilateral lung fields  Abdomen: soft, non tender, non distended, suprapubic cath in place draining light yellow urine  Extremities: no edema  Laboratory:  Recent Labs Lab 02/26/15 0554 02/27/15 0326 02/28/15 0540  WBC 21.8* 17.4* 15.4*  HGB 14.6 13.2 14.0  HCT 44.4 40.6 43.1  PLT 462* 441* 463*    Recent Labs Lab 02/22/15 1430  02/25/15 0511 02/26/15 0554 02/27/15 0326 02/28/15 0540  NA 143  < > 141 140 138 137  K 4.1  < > 3.1* 3.2* 4.3 3.0*  CL 111  < > 97* 101 103 102  CO2 22  --  28 30 24 28   BUN 12  < > 14 18 16 9   CREATININE 0.81  < > 1.32* 1.05 0.82 0.81  CALCIUM 8.7*  --  9.1 9.0 8.3* 8.1*  PROT 6.7  --  7.2  --   --   --   BILITOT 0.8  --  1.3*  --   --   --   ALKPHOS 65  --  76  --   --   --   ALT 9*  --  14*  --   --   --   AST 13*  --  23  --   --   --   GLUCOSE 88  < > 108* 118* 88 106*  < > = values in this interval not displayed.  Imaging/Diagnostic Tests: No results found.   Beaulah Dinninghristina M Gambino, MD 02/28/2015, 7:24 AM PGY-1, [facility] FPTS Intern pager: [phone], text pages welcome

## 2015-02-28 NOTE — Progress Notes (Signed)
Patient requests for medications to help with congestion and coughing. Family Medicine Teaching Service paged and notified.

## 2015-02-28 NOTE — Evaluation (Addendum)
Physical Therapy Evaluation/Discharge Patient Details Name: DARICK FETTERS MRN: [id number] DOB: 1950/04/23 Today's Date: 02/28/2015   History of Present Illness  Pt adm with PNA and sepsis. PMH - brain aneurysm, spinal paraplegia, depression, clotting disorder.  Clinical Impression  Pt at baseline with mobility. Okay to return to ALF from PT standpoint when medically ready.    Follow Up Recommendations No PT follow up    Equipment Recommendations  None recommended by PT    Recommendations for Other Services       Precautions / Restrictions Precautions Precautions: Fall      Mobility  Bed Mobility Overal bed mobility: Modified Independent             General bed mobility comments: Uses rail  Transfers Overall transfer level: Modified independent Equipment used: None             General transfer comment: With simulated pivot transfer pt able to move between two points on EOB  Ambulation/Gait                Stairs            Wheelchair Mobility    Modified Rankin (Stroke Patients Only)       Balance                                             Pertinent Vitals/Pain Pain Assessment: Faces Faces Pain Scale: Hurts little more Pain Location: back  Pain Descriptors / Indicators: Grimacing Pain Intervention(s): Limited activity within patient's tolerance;Repositioned    Home Living Family/patient expects to be discharged to:: Assisted living               Home Equipment: Wheelchair - manual      Prior Function Level of Independence: Needs assistance   Gait / Transfers Assistance Needed: Modified independent with w/c transfer performing pivot           Hand Dominance        Extremity/Trunk Assessment   Upper Extremity Assessment: Overall WFL for tasks assessed           Lower Extremity Assessment: RLE deficits/detail;LLE deficits/detail RLE Deficits / Details: <2/5 LLE Deficits / Details:  <2/5     Communication   Communication: No difficulties  Cognition Arousal/Alertness: Awake/alert Behavior During Therapy: WFL for tasks assessed/performed Overall Cognitive Status: Within Functional Limits for tasks assessed                      General Comments      Exercises        Assessment/Plan    PT Assessment Patent does not need any further PT services  PT Diagnosis Other (comment) (paraplegia)   PT Problem List    PT Treatment Interventions     PT Goals (Current goals can be found in the Care Plan section) Acute Rehab PT Goals Patient Stated Goal: return to [facility] PT Goal Formulation: All assessment and education complete, DC therapy    Frequency     Barriers to discharge        Co-evaluation               End of Session   Activity Tolerance: Patient tolerated treatment well Patient left: in bed;with call bell/phone within reach;with bed alarm set Nurse Communication: Mobility status  Time: 1355-1409 PT Time Calculation (min) (ACUTE ONLY): 14 min   Charges:   PT Evaluation $PT Eval Low Complexity: 1 Procedure     PT G Codes:        Shandell Giovanni 02/28/2015, 2:47 PM [facility] [phone]

## 2015-02-28 NOTE — Consult Note (Signed)
   [facility] CM Inpatient Consult   02/28/2015  YANUEL TAGG [birth year] [id number]  Patient declined: Referral received to assess for care management services. Explained that [place] [facility] is a covered benefit of insurance.   Met with the patient regarding the benefits of [place] [facility] services. Patient endorses that he is at [facility].  He states that his primary care provider is [facility].   He feels that he was re-admitted because he felt he "was still sick when he left, he states.  I feel much better this time but I don't need any further help than what I have.  I hope I am not being hateful to you but, I am ready just to go and they told me I was discharged this morning and I am still sitting here waiting. " Review information for [facility] Care Management and a brochure was provided with contact information.  Explained that [place] [facility] does not interfere with or replace any services arranged by the inpatient care management staff.  Patient declined services with [facility] [facility], stating, "I have everything I need when I get there with my home health and all." For questions, please call: Natividad Brood, RN BSN [facility] Liaison  [phone] business mobile phone

## 2015-03-01 ENCOUNTER — Inpatient Hospital Stay: Payer: Medicare Other | Admitting: Family Medicine

## 2015-03-01 LAB — BASIC METABOLIC PANEL
ANION GAP: 15 (ref 5–15)
BUN: 11 mg/dL (ref 6–20)
CHLORIDE: 101 mmol/L (ref 101–111)
CO2: 24 mmol/L (ref 22–32)
Calcium: 9 mg/dL (ref 8.9–10.3)
Creatinine, Ser: 0.84 mg/dL (ref 0.61–1.24)
GFR calc Af Amer: >60 mL/min (ref >60)
GLUCOSE: 88 mg/dL (ref 65–99)
POTASSIUM: 4 mmol/L (ref 3.5–5.1)
SODIUM: 140 mmol/L (ref 135–145)

## 2015-03-01 LAB — PROTIME-INR
INR: 2.25 — AB (ref 0.00–1.49)
Prothrombin Time: 24.6 seconds — ABNORMAL HIGH (ref 11.6–15.2)

## 2015-03-01 MED ORDER — POTASSIUM CHLORIDE CRYS ER 20 MEQ PO TBCR: 20.0000 meq | EXTENDED_RELEASE_TABLET | Freq: Every day | ORAL | Status: AC

## 2015-03-01 MED ORDER — FUROSEMIDE 20 MG PO TABS
20.0000 mg | ORAL_TABLET | ORAL | Status: DC
Start: 2015-03-01 — End: 2015-04-01

## 2015-03-01 MED ORDER — POTASSIUM CHLORIDE CRYS ER 20 MEQ PO TBCR: 40.0000 meq | EXTENDED_RELEASE_TABLET | Freq: Two times a day (BID) | ORAL | Status: DC

## 2015-03-01 MED ORDER — WARFARIN SODIUM 6 MG PO TABS: 3.0000 mg | ORAL_TABLET | Freq: Every day | ORAL | Status: DC

## 2015-03-01 MED ORDER — POTASSIUM CHLORIDE 10 MEQ/100ML IV SOLN
10.0000 meq | INTRAVENOUS | Status: DC
Start: 2015-03-01 — End: 2015-03-01

## 2015-03-01 MED ORDER — POTASSIUM CHLORIDE CRYS ER 20 MEQ PO TBCR
20.0000 meq | EXTENDED_RELEASE_TABLET | Freq: Every day | ORAL | Status: DC
  Administered 2015-03-01: 20 meq via ORAL
  Filled 2015-03-01: qty 1

## 2015-03-01 MED ORDER — LORAZEPAM 2 MG/ML IJ SOLN: 0.5000 mg | Freq: Once | INTRAMUSCULAR | Status: DC

## 2015-03-01 MED ORDER — DIAZEPAM 5 MG PO TABS
5.0000 mg | ORAL_TABLET | Freq: Once | ORAL | Status: AC
  Administered 2015-03-01: 5 mg via ORAL
  Filled 2015-03-01: qty 1

## 2015-03-01 MED ORDER — LORAZEPAM 0.5 MG PO TABS: 0.5000 mg | ORAL_TABLET | Freq: Once | ORAL | Status: DC

## 2015-03-01 MED ORDER — LEVOFLOXACIN 750 MG PO TABS: 750.0000 mg | ORAL_TABLET | Freq: Every day | ORAL | Status: AC

## 2015-03-01 NOTE — Progress Notes (Signed)
Patient will DC to: [facility] Anticipated DC date: 03/01/15 Family notified: LM for Son Transport by: PTAR  CSW signing off.  Cristobal GoldmannNadia Minerva Bluett, ConnecticutLCSWA Clinical Social Worker [phone]

## 2015-03-01 NOTE — Care Management Note (Signed)
Case Management Note  Patient Details  Name: Daniel Wolfe MRN: [id number] Date of Birth: April 22, 1950  Subjective/Objective:       Patient is for [facility], he is active with [facility] for [facility] for catheter care and pt/inr draw. NCM put order in patient's packet.              Action/Plan:   Expected Discharge Date:                  Expected Discharge Plan:  Assisted Living / Rest Home  In-House Referral:  Clinical Social Work  Discharge planning Services  CM Consult  Post Acute Care Choice:  Resumption of Svcs/PTA Provider Choice offered to:     DME Arranged:  Oxygen DME Agency:  [facility]  [facility]     Status of Service:  Completed, signed off  Medicare Important Message Given:  Yes Date Medicare IM Given:    Medicare IM give by:    Date Additional Medicare IM Given:    Additional Medicare Important Message give by:     If discussed at Long Length of Stay Meetings, dates discussed:    Additional Comments:  Leone Havenaylor, Braelynne Garinger Clinton, RN 03/01/2015, 2:54 PM

## 2015-03-01 NOTE — Progress Notes (Signed)
FPTS Interim Progress Note  S: Went to see the patient in response to a page from RN about anxiety attack. When arrived patient sitting in bed with bucket in his hand. He reports being anxious. He was also heaving. I noted whitish sputum with red-tinge in his bucket. No frank blood. He says was nauseous and heaving since he ate dinner. He reports having some jello after dinner. Denies chest pain, abdominal pain or shortness of breath. He was given valium 4 hours ago but that didn't help his anxiety much.    O: BP 139/89 mmHg  Pulse 103  Temp(Src) 98.1 F (36.7 C) (Oral)  Resp 16  Ht 5\' 10"  (1.778 m)  Wt 215 lb 9.6 oz (97.796 kg)  BMI 30.94 kg/m2  SpO2 96%  Gen: some distress from heaving Oropharynx: without teeth, appears clear, no notable blood or bleeding source Resp: no work of breathing CVS: RRR, no murmurs Abdomen: normal BS, no tenderness to palpation.   A/P: Anxiety: his long term home meds are on hold for clarification. ACS unlikely w/o chest pain or shortness of breath -Gave valium 5 mg once. Not benzo naive -Encouraged paper bag breathing   Nauea/vomiting: Continue promethazine.  Red-tinged sputum: likely jello vs blood. He is on warfarin. Last INR 2.9. He is HDS. -Continue to monitor   Almon Herculesaye T Moriya Mitchell, MD 03/01/2015, 12:33 AM PGY-1, [facility] Service pager [phone]

## 2015-03-01 NOTE — Progress Notes (Signed)
Informed Dr. Jonathon JordanGambino pt's O2 sat 97% on room air.

## 2015-03-01 NOTE — Progress Notes (Addendum)
Attempted to call report to [facility], nurse never returned to phone.

## 2015-03-01 NOTE — Progress Notes (Addendum)
Patient called and verbalized he is having slight panic attack, just had his dose of bedtime valium, called on call MD. Dr.  Alanda SlimGonfa  made aware and will see patient. With orders made, another dose of valium x1 was given. Patient appears to be calm. Will continue to monitor.

## 2015-03-01 NOTE — Clinical Social Work Note (Signed)
Patient transferred from 3S to [phone]. Handoff provided to receiving Child psychotherapistsocial worker.   Genelle BalVanessa Ezrael Sam, MSW, LCSW Licensed Clinical Social Worker Clinical Social Work Department [facility] [phone]

## 2015-03-01 NOTE — NC FL2 (Signed)
[place] MEDICAID FL2 LEVEL OF CARE SCREENING TOOL     IDENTIFICATION  [name] Birthdate: 1951-02-18 Sex: male Admission Date (Current Location): 02/25/2015  [facility] and [place] Number:  Producer, television/film/video and Address:  [facility], [address], [place], [place] [address]      Provider Number: [id number]  Attending Physician Name and Address:  Tobey Grim, MD  Relative Name and Phone Number:       Current Level of Care: Hospital Recommended Level of Care: Assisted Living Facility Prior Approval Number:    Date Approved/Denied:   PASRR Number:    Discharge Plan: Other (Comment) (Return to ALF)    Current Diagnoses: Patient Active Problem List   Diagnosis Date Noted  . Chronic anticoagulation-Coumadin 02/26/2015  . Clotting disorder (HCC) 02/26/2015  . Abnormal EKG- new TWI 02/26/2015  . Hypokalemia 02/26/2015  . Dyspnea   . Sepsis due to pneumonia (HCC) 02/25/2015  . Respiratory distress   . HCAP (healthcare-associated pneumonia) 02/23/2015  . Oxygen desaturation   . Acute pulmonary edema (HCC)   . Pneumonia   . Epiglottitis 02/20/2015  . Acute pharyngitis 02/20/2015  . Sore throat 02/20/2015  . S/P cerebral aneurysm coiling 2008 08/23/2013  . SAH (subarachnoid hemorrhage) 2008 08/23/2013  . Paraplegia (HCC) 08/23/2013    Orientation RESPIRATION BLADDER Height & Weight    Self, Time, Situation, Place  Normal Indwelling catheter (Urinary catheter) 5\' 10"  (177.8 cm) 215 lbs.  BEHAVIORAL SYMPTOMS/MOOD NEUROLOGICAL BOWEL NUTRITION STATUS  Other (Comment) (N/A)  (N/A) Continent Diet (Cardiac)  AMBULATORY STATUS COMMUNICATION OF NEEDS Skin   Limited Assist (Patient at baseline) Verbally Normal                       Personal Care Assistance Level of Assistance  Bathing, Feeding, Dressing Bathing Assistance: Maximum assistance Feeding assistance: Independent Dressing Assistance: Limited assistance      Functional Limitations Info   (N/A)          SPECIAL CARE FACTORS FREQUENCY                       Contractures      Additional Factors Info  Code Status, Allergies, Isolation Precautions Code Status Info: DNR Allergies Info: Penicillins, Senna, Sulfa Antibiotics     Isolation Precautions Info: MRSA     Current Medications (03/01/2015):   Discharge Medications: STOP taking these medications       acetaminophen 325 MG tablet  Commonly known as: TYLENOL     azithromycin 250 MG tablet  Commonly known as: ZITHROMAX     gabapentin 600 MG tablet  Commonly known as: NEURONTIN     phenol 1.4 % Liqd  Commonly known as: CHLORASEPTIC     sertraline 100 MG tablet  Commonly known as: ZOLOFT      TAKE these medications       baclofen 10 MG tablet  Commonly known as: LIORESAL  Take 5 mg by mouth daily.     bisacodyl 5 MG EC tablet  Commonly known as: DULCOLAX  Take 5 mg by mouth daily.     Cranberry 250 MG Caps  Take 1 capsule by mouth 2 (two) times daily.     diazepam 5 MG tablet  Commonly known as: VALIUM  Take 5 mg by mouth 2 (two) times daily.     DULoxetine 30 MG capsule  Commonly known as: CYMBALTA  Take  30 mg by mouth 3 (three) times daily.     furosemide 20 MG tablet  Commonly known as: LASIX  Take 1 tablet (20 mg total) by mouth 3 (three) times a week.     levofloxacin 750 MG tablet  Commonly known as: LEVAQUIN  Take 1 tablet (750 mg total) by mouth daily.     loperamide 2 MG tablet  Commonly known as: IMODIUM A-D  Take 2 mg by mouth as needed for diarrhea or loose stools.     LORazepam 0.5 MG tablet  Commonly known as: ATIVAN  Take 0.5 mg by mouth daily as needed for anxiety (anxiety).     LYRICA 75 MG capsule  Generic drug: pregabalin  Take 75 mg by mouth 2 (two) times daily.     mirabegron ER 50 MG Tb24 tablet  Commonly known as:  MYRBETRIQ  Take 50 mg by mouth daily.     mupirocin ointment 2 %  Commonly known as: BACTROBAN  Place 1 application into the nose 3 (three) times daily as needed (to supra pubic area to prevent infection).     ondansetron 4 MG disintegrating tablet  Commonly known as: ZOFRAN-ODT  Take 4 mg by mouth every 6 (six) hours as needed for nausea or vomiting.     POLYETHYLENE GLYCOL 3350 PO  Take 17 g/mL by mouth daily.     potassium chloride SA 20 MEQ tablet  Commonly known as: K-DUR,KLOR-CON  Take 1 tablet (20 mEq total) by mouth daily.     QUEtiapine 50 MG tablet  Commonly known as: SEROQUEL  Take 50-100 mg by mouth 2 (two) times daily. 50 mg in the am and 100 mg at bedtime.     ranitidine 150 MG tablet  Commonly known as: ZANTAC  Take 150 mg by mouth 2 (two) times daily.     tamsulosin 0.4 MG Caps capsule  Commonly known as: FLOMAX  Take 0.4 mg by mouth at bedtime.     traMADol 50 MG tablet  Commonly known as: ULTRAM  Take 50 mg by mouth every 6 (six) hours as needed for moderate pain (pain).     warfarin 6 MG tablet  Commonly known as: COUMADIN  Take 6 mg by mouth daily.           Relevant Imaging Results:  Relevant Lab Results:   Additional Information SSN: [id number]  Mearl Latinadia S Qais Jowers, LCSWA

## 2015-03-01 NOTE — Progress Notes (Signed)
Family Medicine Teaching Service Daily Progress Note Intern Pager: [phone]  [name] Medical record number: [id number] Date of birth: Apr 19, 1950 Age: 65 y.o. Gender: male  Primary Care Provider: MCDIARMID,TODD D, MD Consultants: none Code Status: DNR/DNI (discussed on admission)  Pt Overview and Major Events to Date:  1/7: Admitted to FPTS  Assessment and Plan:  Daniel Wolfe is a 65 y.o. male presenting with multifocal pneumonia and possible pulmonary edema. PMH is significant for Aneurysm, Clotting disorder, Depression, Vitamin D deficiency disease, Constipation, GERD, Neuropathy, Urinary retention, Anxiety, Spinal paraplegia, Neurogenic bladder, Benign prostate hyperplasia, and Brain aneurysm.  # Multifocal pneumonia with resolved respiratory distress. Currently on Levaquin. ABG on admission: pH 7.327, WBC 21.8>>17.4>>15.4 CXR (1/7) with worsening R mid lung opacification.s/p  Cefepime x1 (1/7), Vanc and Aztreonam (1/7-1/10). Blood cx NG x 2 days - Vitals per floor protocol - O2 prn saturations <92%, 1 L [place] currently -Continue Levaquin 750 mg daily (started on 1/10 for 5 day course).   New t-wave inversions on ECG and elevated troponin: No cardiac hx. ECG changes on 1/8. Trop 0.09 x 2. BNP normal. Echocardiogram: EF 55-60%, grade 1 diastolic dysfunction. Right ventricular systolic function was moderately reduced.  - Cardiology was consulted, have signed off - Pt told cardiology he did not want any more invasive work up  #Possible pulmonary edema: s/p 10 U IV Lasix. Down 1.1 L since admission. Still has crackles on lungs with some mild shortness of breath. Has had 3.1 L output in last 24 hours - Strict I/O  -  20 mg Lasix three times weekly (MWF)   # Hypokalemia: Resolved yesterday, back down again today at 3 - BMP in am - 20 meq KDUR daily  #Hypomagnesia: Resolved  # Urinary Retention with Suprapubic Cath: Neurogenic bladder due to paraplegia. Last changed  December 15th, not due for a new one. Does not appear infected. - Continue to ensure cath site is clean and has good urine output - Foley care  # Protein S deficiency: Problem not in Epic but per patient history. Has had DVT in past. Is currently on Coumadin for anticoagulation.  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 Moves extremities but unable to ambulate and utilizes a wheelchair. - Multiple repetitive medications in MAR incl Lyrica, Gabapentin, Baclofen, Seroquel, Cymbalta, Zoloft.  - Will wait for pharm med rec before ordering these. - Attempted to contact ALF ([facility]) to see what medications he has been getting yesterday but no answer, will try again today  # Social: Lives in ALF - c/s SW to assist this patient to return when stable for discharge.  FEN/GI: heart healthy diet Prophylaxis: Coumadin per pharmacy   Disposition: ALF possible today   Subjective:  - Afebrile overnight and this morning.  - Had anxiety attack last night, is feeling better now. States he feels like his brain has a "short circuit" - Still on 1 L oxygen [place] - No complaints of pain this AM - Improved shortness of breath  - Still has cough with productive white sputum  Objective: Temp:  [97.9 F (36.6 C)-98.1 F (36.7 C)] 97.9 F (36.6 C) (01/11 0538) Pulse Rate:  [86-103] 100 (01/11 0538) Resp:  [16-18] 16 (01/11 0538) BP: (127-139)/(78-89) 127/88 mmHg (01/11 0538) SpO2:  [94 %-96 %] 94 % (01/11 0538) Physical Exam: General: In NAD, laying on right side  in bed Cardiovascular: RRR, normal s1 and s2, no murmurs  Respiratory: normal work of breathing, on 1 L [place], crackles present in bilateral lung fields  Abdomen: soft, non tender, non distended, suprapubic cath in place draining light yellow urine  Extremities: no edema  Laboratory:  Recent Labs Lab 02/26/15 0554  02/27/15 0326 02/28/15 0540  WBC 21.8* 17.4* 15.4*  HGB 14.6 13.2 14.0  HCT 44.4 40.6 43.1  PLT 462* 441* 463*    Recent Labs Lab 02/22/15 1430  02/25/15 0511 02/26/15 0554 02/27/15 0326 02/28/15 0540  NA 143  < > 141 140 138 137  K 4.1  < > 3.1* 3.2* 4.3 3.0*  CL 111  < > 97* 101 103 102  CO2 22  --  28 30 24 28   BUN 12  < > 14 18 16 9   CREATININE 0.81  < > 1.32* 1.05 0.82 0.81  CALCIUM 8.7*  --  9.1 9.0 8.3* 8.1*  PROT 6.7  --  7.2  --   --   --   BILITOT 0.8  --  1.3*  --   --   --   ALKPHOS 65  --  76  --   --   --   ALT 9*  --  14*  --   --   --   AST 13*  --  23  --   --   --   GLUCOSE 88  < > 108* 118* 88 106*  < > = values in this interval not displayed.  Imaging/Diagnostic Tests: No results found.   Beaulah Dinninghristina M Gambino, MD 03/01/2015, 7:21 AM PGY-1, [facility] FPTS Intern pager: [phone], text pages welcome

## 2015-03-01 NOTE — Progress Notes (Signed)
NURSING PROGRESS NOTE  Daniel Wolfe [id number] Discharge Data: 03/01/2015 3:23 PM Attending Provider: Tobey Grim, MD WUJ:WJXBJ, Marisue Brooklyn D Rath to be D/C'd [facility] per MD order. All IV's discontinued with no bleeding noted. All belongings sent with patient to take home.   Last Vital Signs:  Blood pressure 127/88, pulse 100, temperature 97.9 F (36.6 C), temperature source Oral, resp. rate 16, height 5\' 10"  (1.778 m), weight 97.796 kg (215 lb 9.6 oz), SpO2 97 %.  Discharge Medication List   Medication List    STOP taking these medications        acetaminophen 325 MG tablet  Commonly known as:  TYLENOL     azithromycin 250 MG tablet  Commonly known as:  ZITHROMAX     gabapentin 600 MG tablet  Commonly known as:  NEURONTIN     phenol 1.4 % Liqd  Commonly known as:  CHLORASEPTIC     sertraline 100 MG tablet  Commonly known as:  ZOLOFT      TAKE these medications        baclofen 10 MG tablet  Commonly known as:  LIORESAL  Take 5 mg by mouth daily.     bisacodyl 5 MG EC tablet  Commonly known as:  DULCOLAX  Take 5 mg by mouth daily.     Cranberry 250 MG Caps  Take 1 capsule by mouth 2 (two) times daily.     diazepam 5 MG tablet  Commonly known as:  VALIUM  Take 5 mg by mouth 2 (two) times daily.     DULoxetine 30 MG capsule  Commonly known as:  CYMBALTA  Take 30 mg by mouth 3 (three) times daily.     furosemide 20 MG tablet  Commonly known as:  LASIX  Take 1 tablet (20 mg total) by mouth 3 (three) times a week.     levofloxacin 750 MG tablet  Commonly known as:  LEVAQUIN  Take 1 tablet (750 mg total) by mouth daily.     loperamide 2 MG tablet  Commonly known as:  IMODIUM A-D  Take 2 mg by mouth as needed for diarrhea or loose stools.     LORazepam 0.5 MG tablet  Commonly known as:  ATIVAN  Take 0.5 mg by mouth daily as needed for anxiety (anxiety).     LYRICA 75 MG capsule  Generic drug:  pregabalin  Take 75 mg by mouth  2 (two) times daily.     mirabegron ER 50 MG Tb24 tablet  Commonly known as:  MYRBETRIQ  Take 50 mg by mouth daily.     mupirocin ointment 2 %  Commonly known as:  BACTROBAN  Place 1 application into the nose 3 (three) times daily as needed (to supra pubic area to prevent infection).     ondansetron 4 MG disintegrating tablet  Commonly known as:  ZOFRAN-ODT  Take 4 mg by mouth every 6 (six) hours as needed for nausea or vomiting.     POLYETHYLENE GLYCOL 3350 PO  Take 17 g/mL by mouth daily.     potassium chloride SA 20 MEQ tablet  Commonly known as:  K-DUR,KLOR-CON  Take 1 tablet (20 mEq total) by mouth daily.     QUEtiapine 50 MG tablet  Commonly known as:  SEROQUEL  Take 50-100 mg by mouth 2 (two) times daily. 50 mg in the am and 100 mg at bedtime.     ranitidine 150 MG tablet  Commonly known as:  ZANTAC  Take 150 mg by mouth 2 (two) times daily.     tamsulosin 0.4 MG Caps capsule  Commonly known as:  FLOMAX  Take 0.4 mg by mouth at bedtime.     traMADol 50 MG tablet  Commonly known as:  ULTRAM  Take 50 mg by mouth every 6 (six) hours as needed for moderate pain (pain).     warfarin 6 MG tablet  Commonly known as:  COUMADIN  Take 0.5 tablets (3 mg total) by mouth daily.         Roma KayserStephanie Dhaval Woo, RN

## 2015-03-01 NOTE — Discharge Instructions (Signed)
You were admitted to the hospital for respiratory failure for worsening pneumonia. We have given you antibiotics and oxygen. We also gave you some diuretic (water pills) to take some fluid off your lungs. Your lungs have improved and you are now stable to go back to [facility]. You will be sent home with an antibiotic, a water pill, and potassium pill.  Please schedule a follow up appointment with Dr. Arnette FeltsJanece Moore to be seen within the next couple days.  It was a pleasure caring for you, take care!  Follow-up Information    Schedule an appointment as soon as possible for a visit with Arnette FeltsMoore, Janece.   Specialty:  General Practice   Why:  hospital follow up   Contact information:   [address] [place] [id number] [phone]

## 2015-03-01 NOTE — Care Management Important Message (Signed)
Important Message  Patient Details  Name: Daniel Wolfe MRN: [id number] Date of Birth: 08/04/1950   Medicare Important Message Given:  Yes    Leone Havenaylor, Sarai January Clinton, RN 03/01/2015, 2:37 PMImportant Message  Patient Details  Name: Daniel Wolfe MRN: [id number] Date of Birth: 07/29/1950   Medicare Important Message Given:  Yes    Leone Havenaylor, Jojuan Champney Clinton, RN 03/01/2015, 2:37 PM

## 2015-03-02 ENCOUNTER — Emergency Department (HOSPITAL_COMMUNITY)
Admission: EM | Admit: 2015-03-02 | Discharge: 2015-03-02 | Discharge status: Against Medical Advice | Payer: Commercial Managed Care - HMO | Attending: Emergency Medicine | Admitting: Emergency Medicine

## 2015-03-02 ENCOUNTER — Encounter (HOSPITAL_COMMUNITY): Payer: Self-pay

## 2015-03-02 DIAGNOSIS — Z760 Encounter for issue of repeat prescription: Secondary | ICD-10-CM | POA: Diagnosis not present

## 2015-03-02 DIAGNOSIS — R110 Nausea: Secondary | ICD-10-CM | POA: Diagnosis not present

## 2015-03-02 LAB — CULTURE, BLOOD (ROUTINE X 2)
Culture: NO GROWTH
Culture: NO GROWTH

## 2015-03-02 NOTE — ED Notes (Signed)
Pt wanting to leave and requested that staff call a cab.

## 2015-03-02 NOTE — ED Notes (Signed)
Per [facility], pt was released from 5W at around 1500 today with Rx for zofran. Pt states he can't take it because it makes him through up even more. [facility] is [facility] and didn't realize they wrote him a Rx for that. Would have requested something else. [facility] staff asked facility to attempt to contact floor and physician to have another Rx written and faxed and was told that it would take too long.

## 2015-04-01 ENCOUNTER — Encounter (HOSPITAL_COMMUNITY): Payer: Self-pay | Admitting: Emergency Medicine

## 2015-04-01 ENCOUNTER — Inpatient Hospital Stay (HOSPITAL_COMMUNITY): Payer: Commercial Managed Care - HMO

## 2015-04-01 ENCOUNTER — Emergency Department (HOSPITAL_COMMUNITY): Payer: Commercial Managed Care - HMO

## 2015-04-01 ENCOUNTER — Inpatient Hospital Stay (HOSPITAL_COMMUNITY)
Admission: EM | Admit: 2015-04-01 | Discharge: 2015-04-03 | DRG: 194 | Disposition: A | Discharge status: Home / Self Care | Payer: Commercial Managed Care - HMO | Attending: Internal Medicine | Admitting: Internal Medicine

## 2015-04-01 DIAGNOSIS — N319 Neuromuscular dysfunction of bladder, unspecified: Secondary | ICD-10-CM | POA: Diagnosis present

## 2015-04-01 DIAGNOSIS — F329 Major depressive disorder, single episode, unspecified: Secondary | ICD-10-CM | POA: Diagnosis present

## 2015-04-01 DIAGNOSIS — Z66 Do not resuscitate: Secondary | ICD-10-CM | POA: Diagnosis present

## 2015-04-01 DIAGNOSIS — I959 Hypotension, unspecified: Secondary | ICD-10-CM | POA: Diagnosis present

## 2015-04-01 DIAGNOSIS — Z9981 Dependence on supplemental oxygen: Secondary | ICD-10-CM

## 2015-04-01 DIAGNOSIS — Z79899 Other long term (current) drug therapy: Secondary | ICD-10-CM | POA: Diagnosis not present

## 2015-04-01 DIAGNOSIS — R070 Pain in throat: Secondary | ICD-10-CM | POA: Diagnosis present

## 2015-04-01 DIAGNOSIS — R339 Retention of urine, unspecified: Secondary | ICD-10-CM | POA: Diagnosis present

## 2015-04-01 DIAGNOSIS — D6859 Other primary thrombophilia: Secondary | ICD-10-CM | POA: Diagnosis present

## 2015-04-01 DIAGNOSIS — R0600 Dyspnea, unspecified: Secondary | ICD-10-CM

## 2015-04-01 DIAGNOSIS — Z888 Allergy status to other drugs, medicaments and biological substances status: Secondary | ICD-10-CM | POA: Diagnosis not present

## 2015-04-01 DIAGNOSIS — Y95 Nosocomial condition: Secondary | ICD-10-CM | POA: Diagnosis present

## 2015-04-01 DIAGNOSIS — Z882 Allergy status to sulfonamides status: Secondary | ICD-10-CM | POA: Diagnosis not present

## 2015-04-01 DIAGNOSIS — G629 Polyneuropathy, unspecified: Secondary | ICD-10-CM | POA: Diagnosis present

## 2015-04-01 DIAGNOSIS — F419 Anxiety disorder, unspecified: Secondary | ICD-10-CM | POA: Diagnosis present

## 2015-04-01 DIAGNOSIS — Z7901 Long term (current) use of anticoagulants: Secondary | ICD-10-CM | POA: Diagnosis not present

## 2015-04-01 DIAGNOSIS — K219 Gastro-esophageal reflux disease without esophagitis: Secondary | ICD-10-CM | POA: Diagnosis present

## 2015-04-01 DIAGNOSIS — J189 Pneumonia, unspecified organism: Secondary | ICD-10-CM | POA: Diagnosis present

## 2015-04-01 DIAGNOSIS — Z880 Allergy status to penicillin: Secondary | ICD-10-CM

## 2015-04-01 DIAGNOSIS — J9611 Chronic respiratory failure with hypoxia: Secondary | ICD-10-CM | POA: Diagnosis present

## 2015-04-01 DIAGNOSIS — N401 Enlarged prostate with lower urinary tract symptoms: Secondary | ICD-10-CM | POA: Diagnosis present

## 2015-04-01 DIAGNOSIS — E559 Vitamin D deficiency, unspecified: Secondary | ICD-10-CM | POA: Diagnosis present

## 2015-04-01 DIAGNOSIS — D689 Coagulation defect, unspecified: Secondary | ICD-10-CM | POA: Diagnosis not present

## 2015-04-01 DIAGNOSIS — R0603 Acute respiratory distress: Secondary | ICD-10-CM

## 2015-04-01 DIAGNOSIS — F32A Depression, unspecified: Secondary | ICD-10-CM | POA: Diagnosis present

## 2015-04-01 LAB — CBC WITH DIFFERENTIAL/PLATELET
BASOS ABS: 0.1 10*3/uL (ref 0.0–0.1)
BASOS PCT: 1 %
Eosinophils Absolute: 0.6 10*3/uL (ref 0.0–0.7)
Eosinophils Relative: 6 %
HEMATOCRIT: 41 % (ref 39.0–52.0)
Hemoglobin: 13.1 g/dL (ref 13.0–17.0)
LYMPHS PCT: 27 %
Lymphs Abs: 2.7 10*3/uL (ref 0.7–4.0)
MCH: 30.7 pg (ref 26.0–34.0)
MCHC: 32 g/dL (ref 30.0–36.0)
MCV: 96 fL (ref 78.0–100.0)
MONO ABS: 1.4 10*3/uL — AB (ref 0.1–1.0)
MONOS PCT: 14 %
NEUTROS ABS: 5.3 10*3/uL (ref 1.7–7.7)
Neutrophils Relative %: 52 %
PLATELETS: 452 10*3/uL — AB (ref 150–400)
RBC: 4.27 MIL/uL (ref 4.22–5.81)
RDW: 16.6 % — AB (ref 11.5–15.5)
WBC: 10.1 10*3/uL (ref 4.0–10.5)

## 2015-04-01 LAB — BASIC METABOLIC PANEL
ANION GAP: 11 (ref 5–15)
BUN: 6 mg/dL (ref 6–20)
CALCIUM: 8.2 mg/dL — AB (ref 8.9–10.3)
CO2: 26 mmol/L (ref 22–32)
Chloride: 103 mmol/L (ref 101–111)
Creatinine, Ser: 1.09 mg/dL (ref 0.61–1.24)
Glucose, Bld: 99 mg/dL (ref 65–99)
POTASSIUM: 4.3 mmol/L (ref 3.5–5.1)
Sodium: 140 mmol/L (ref 135–145)

## 2015-04-01 LAB — HIV ANTIBODY (ROUTINE TESTING W REFLEX): HIV Screen 4th Generation wRfx: NONREACTIVE

## 2015-04-01 LAB — STREP PNEUMONIAE URINARY ANTIGEN: STREP PNEUMO URINARY ANTIGEN: NEGATIVE

## 2015-04-01 LAB — PROTIME-INR
INR: 2.72 — AB (ref 0.00–1.49)
INR: 2.54 — ABNORMAL HIGH (ref 0.00–1.49)
Prothrombin Time: 28.4 seconds — ABNORMAL HIGH (ref 11.6–15.2)
Prothrombin Time: 27 seconds — ABNORMAL HIGH (ref 11.6–15.2)

## 2015-04-01 LAB — MRSA PCR SCREENING: MRSA by PCR: NEGATIVE

## 2015-04-01 LAB — I-STAT CG4 LACTIC ACID, ED: Lactic Acid, Venous: 1.25 mmol/L (ref 0.5–2.0)

## 2015-04-01 LAB — PROCALCITONIN

## 2015-04-01 MED ORDER — QUETIAPINE FUMARATE 25 MG PO TABS
50.0000 mg | ORAL_TABLET | Freq: Two times a day (BID) | ORAL | Status: DC
  Filled 2015-04-01 (×2): qty 4

## 2015-04-01 MED ORDER — DIAZEPAM 5 MG PO TABS
5.0000 mg | ORAL_TABLET | Freq: Two times a day (BID) | ORAL | Status: DC
  Administered 2015-04-01 – 2015-04-03 (×5): 5 mg via ORAL
  Filled 2015-04-01 (×5): qty 1

## 2015-04-01 MED ORDER — WARFARIN SODIUM 4 MG PO TABS
4.5000 mg | ORAL_TABLET | Freq: Every day | ORAL | Status: DC
  Administered 2015-04-01 – 2015-04-02 (×2): 4.5 mg via ORAL
  Filled 2015-04-01 (×4): qty 1

## 2015-04-01 MED ORDER — IPRATROPIUM-ALBUTEROL 0.5-2.5 (3) MG/3ML IN SOLN
6.0000 mL | Freq: Once | RESPIRATORY_TRACT | Status: AC
  Administered 2015-04-01: 6 mL via RESPIRATORY_TRACT
  Filled 2015-04-01: qty 6

## 2015-04-01 MED ORDER — DULOXETINE HCL 30 MG PO CPEP
30.0000 mg | ORAL_CAPSULE | Freq: Three times a day (TID) | ORAL | Status: DC
  Administered 2015-04-01 – 2015-04-03 (×8): 30 mg via ORAL
  Filled 2015-04-01 (×10): qty 1

## 2015-04-01 MED ORDER — VANCOMYCIN HCL IN DEXTROSE 750-5 MG/150ML-% IV SOLN
750.0000 mg | Freq: Two times a day (BID) | INTRAVENOUS | Status: DC
  Administered 2015-04-01 – 2015-04-02 (×3): 750 mg via INTRAVENOUS
  Filled 2015-04-01 (×4): qty 150

## 2015-04-01 MED ORDER — SODIUM CHLORIDE 0.9 % IV BOLUS (SEPSIS)
1000.0000 mL | Freq: Once | INTRAVENOUS | Status: AC
  Administered 2015-04-01: 1000 mL via INTRAVENOUS

## 2015-04-01 MED ORDER — DEXTROSE 5 % IV SOLN
2.0000 g | Freq: Three times a day (TID) | INTRAVENOUS | Status: DC
  Administered 2015-04-01 – 2015-04-03 (×6): 2 g via INTRAVENOUS
  Filled 2015-04-01 (×10): qty 2

## 2015-04-01 MED ORDER — PREGABALIN 75 MG PO CAPS
75.0000 mg | ORAL_CAPSULE | Freq: Two times a day (BID) | ORAL | Status: DC
  Administered 2015-04-01 – 2015-04-03 (×5): 75 mg via ORAL
  Filled 2015-04-01 (×5): qty 1

## 2015-04-01 MED ORDER — QUETIAPINE FUMARATE 50 MG PO TABS
50.0000 mg | ORAL_TABLET | Freq: Every day | ORAL | Status: DC
  Administered 2015-04-01 – 2015-04-03 (×3): 50 mg via ORAL
  Filled 2015-04-01 (×3): qty 1

## 2015-04-01 MED ORDER — POLYETHYLENE GLYCOL 3350 17 G PO PACK
17.0000 g | PACK | Freq: Every day | ORAL | Status: DC
  Filled 2015-04-01 (×3): qty 1

## 2015-04-01 MED ORDER — VANCOMYCIN HCL 10 G IV SOLR
1500.0000 mg | Freq: Once | INTRAVENOUS | Status: AC
  Administered 2015-04-01: 1500 mg via INTRAVENOUS
  Filled 2015-04-01: qty 1500

## 2015-04-01 MED ORDER — GUAIFENESIN ER 600 MG PO TB12: 600.0000 mg | ORAL_TABLET | Freq: Two times a day (BID) | ORAL | Status: DC | PRN

## 2015-04-01 MED ORDER — MENTHOL 3 MG MT LOZG
1.0000 | LOZENGE | OROMUCOSAL | Status: DC | PRN
  Filled 2015-04-01 (×2): qty 9

## 2015-04-01 MED ORDER — SERTRALINE HCL 100 MG PO TABS
100.0000 mg | ORAL_TABLET | Freq: Every day | ORAL | Status: DC
  Administered 2015-04-01 – 2015-04-03 (×3): 100 mg via ORAL
  Filled 2015-04-01 (×3): qty 1

## 2015-04-01 MED ORDER — BISACODYL 5 MG PO TBEC
5.0000 mg | DELAYED_RELEASE_TABLET | Freq: Every day | ORAL | Status: DC
  Administered 2015-04-01 – 2015-04-03 (×2): 5 mg via ORAL
  Filled 2015-04-01 (×3): qty 1

## 2015-04-01 MED ORDER — WARFARIN SODIUM 2 MG PO TABS: 2.0000 mg | ORAL_TABLET | Freq: Every day | ORAL | Status: DC

## 2015-04-01 MED ORDER — WARFARIN - PHARMACIST DOSING INPATIENT
Freq: Every day | Status: DC
  Administered 2015-04-01: 18:00:00

## 2015-04-01 MED ORDER — QUETIAPINE FUMARATE 100 MG PO TABS
100.0000 mg | ORAL_TABLET | Freq: Every day | ORAL | Status: DC
  Administered 2015-04-01 – 2015-04-02 (×2): 100 mg via ORAL
  Filled 2015-04-01 (×2): qty 1

## 2015-04-01 MED ORDER — SODIUM CHLORIDE 0.9 % IV SOLN
INTRAVENOUS | Status: DC
  Administered 2015-04-01 (×3): via INTRAVENOUS

## 2015-04-01 MED ORDER — DEXTROSE 5 % IV SOLN
2.0000 g | Freq: Once | INTRAVENOUS | Status: AC
  Administered 2015-04-01: 2 g via INTRAVENOUS
  Filled 2015-04-01: qty 2

## 2015-04-01 MED ORDER — WARFARIN SODIUM 2.5 MG PO TABS: 2.5000 mg | ORAL_TABLET | Freq: Every day | ORAL | Status: DC

## 2015-04-01 NOTE — Evaluation (Signed)
Clinical/Bedside Swallow Evaluation Patient Details  Name: ZAI CHMIEL MRN: [id number] Date of Birth: Oct 29, 1950  Today's Date: 04/01/2015 Time: SLP Start Time (ACUTE ONLY): 6578 SLP Stop Time (ACUTE ONLY): 0928 SLP Time Calculation (min) (ACUTE ONLY): 11 min  Past Medical History:  Past Medical History  Diagnosis Date  . Clotting disorder (HCC)   . Depressed   . Vitamin D deficiency disease   . Constipation   . GERD (gastroesophageal reflux disease)   . Neuropathy (HCC)   . Urinary retention   . Anxiety   . Spinal paraplegia (HCC) 2008  . Neurogenic bladder   . Benign prostate hyperplasia   . Brain aneurysm 2008    s/p coiling   . 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 Cholecystectomy    . Splenectomy     HPI:  65 y.o. male with h/o protein S clotting disorder, GERD, vitamin D deficiency disease who presents to the Emergency Department complaining of moderate SOB. Pt was recently hospitalized for PNA and CT Chest performed 2/11 is cocnerning for RLL PNA.   Assessment / Plan / Recommendation Clinical Impression  Pt's oropharyngeal swallow appears WFL with trials of thin liquid via straw and mixed consistencies as he swallowed his pills with liquid wash. Pt would not try any solid consistencies despite encouragement from SLP/RN and education about the rationale for assessment. Pt likely has adequate oropharyngeal function overall; however, given recurrent PNA in the RLL and decreased respiratory status, it would be important to see pt across more intake than what he was willing to take in this morning. Recommend to continue regular diet and thin liquids, but will return for additional skilled observation.    Aspiration Risk  Mild aspiration risk    Diet Recommendation Regular;Thin liquid   Liquid Administration via: Cup;Straw Medication Administration: Whole meds with  liquid Supervision: Patient able to self feed;Intermittent supervision to cue for compensatory strategies Compensations: Slow rate;Small sips/bites Postural Changes: Seated upright at 90 degrees    Other  Recommendations Oral Care Recommendations: Oral care BID   Follow up Recommendations   (tba)    Frequency and Duration min 1 x/week  1 week       Prognosis        Swallow Study   General HPI: 65 y.o. male with h/o protein S clotting disorder, GERD, vitamin D deficiency disease who presents to the Emergency Department complaining of moderate SOB. Pt was recently hospitalized for PNA and CT Chest performed 2/11 is cocnerning for RLL PNA. Type of Study: Bedside Swallow Evaluation Previous Swallow Assessment: none in chart Diet Prior to this Study: Regular;Thin liquids Temperature Spikes Noted: No Respiratory Status: Nasal cannula History of Recent Intubation: No Behavior/Cognition: Alert;Uncooperative Oral Care Completed by SLP: No Vision: Functional for self-feeding Self-Feeding Abilities: Able to feed self Patient Positioning: Upright in bed Baseline Vocal Quality: Normal    Oral/Motor/Sensory Function     Ice Chips Ice chips: Not tested   Thin Liquid Thin Liquid: Within functional limits Presentation: Self Fed;Straw Other Comments: mixed consistency with pills also WFL    Nectar Thick Nectar Thick Liquid: Not tested   Honey Thick Honey Thick Liquid: Not tested   Puree Puree: Not tested   Solid   GO   Solid: Not tested       Maxcine Ham, M.A. CCC-SLP [phone]  Maxcine Ham 04/01/2015,9:37 AM

## 2015-04-01 NOTE — ED Notes (Signed)
Attempted report 

## 2015-04-01 NOTE — ED Notes (Signed)
Pt arrives via EMS from [facility] with shortness of breath starting around 2300 tonight. States "my body woke me up, I was having some rales." Desating with EMS 87, placed on 3L and came up to 94%. Recent DX with pneumonia and was hospitalized, sent home with O2 which he has elected to discontinue.

## 2015-04-01 NOTE — Progress Notes (Signed)
Patient seen and examined, admitted by Dr. Julian Reil earlier this morning.  Briefly 65 year old male with hypercoagulability secondary to protein S deficiency/ clotting disorder presented with dyspnea, worsening nonproductive cough, recently hospitalized in 1/17 with HCAP. BP 107/74 mmHg  Pulse 77  Temp(Src) 97.3 F (36.3 C) (Oral)  Resp 16  Ht  (1.778 m)  Wt 96.2 kg (212 lb 1.3 oz)  BMI 30.43 kg/m2  SpO2 97%  Imagings reviewed.  HCAP - CT chest showed bibasilar groundglass densities with patchy area of consolidated changes at the right lung base concerning for pneumonia - BP borderline, continue IV fluids today, ordered pro-calcitonin, follow urine legionella antigen, urine strep antigen, sputum culture, HIV - Given right lung pneumonia, swallow evaluation ordered, mild aspiration risk -Continue vancomycin, cefepime  Protein S deficiency/hypercoagulability Continue warfarin per pharmacy  Will follow  RAI,RIPUDEEP M.D. [facility] 04/01/2015, 10:19 AM  Pager: [phone]

## 2015-04-01 NOTE — Progress Notes (Signed)
ANTICOAGULATION AND ANTIBIOTIC CONSULT NOTE - Initial Consult  Pharmacy Consult for Coumadin and Vancocin/Maxipime Indication: Protein S clotting disorder and PNA  Allergies  Allergen Reactions  . Penicillins Other (See Comments)    Unknown on MAR  . Senna Nausea And Vomiting  . Sulfa Antibiotics Other (See Comments)    Unknown on MAR    Vital Signs: Temp: 98.5 F (36.9 C) (02/11 0004) Temp Source: Oral (02/11 0004) BP: 86/61 mmHg (02/11 0215) Pulse Rate: 85 (02/11 0215)  Labs:  Recent Labs  04/01/15 0108  HGB 13.1  HCT 41.0  PLT 452*  LABPROT 28.4*  INR 2.72*  CREATININE 1.09    Medical History: Past Medical History  Diagnosis Date  . Clotting disorder (HCC)   . Depressed   . Vitamin D deficiency disease   . Constipation   . GERD (gastroesophageal reflux disease)   . Neuropathy (HCC)   . Urinary retention   . Anxiety   . Spinal paraplegia (HCC) 2008  . Neurogenic bladder   . Benign prostate hyperplasia   . Brain aneurysm 2008    s/p coiling   . 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  Goal of Therapy:  INR 2-3 Vanc trough 15-20   Plan:  Vanc 1.5g and cefepime 2g IV ordered by EDP; will continue with vancomycin  IV Q12H and cefepime 2g IV Q8H and monitor CBC, Cx, levels prn.  Will continue home Coumadin dose of 4.5mg  daily and monitor INR.  Vernard Gambles, PharmD, BCPS  04/01/2015,2:27 AM

## 2015-04-01 NOTE — ED Provider Notes (Signed)
CSN: [id number]     Arrival date & time 04/01/15  0000 History   By signing my name below, I, Daniel Wolfe, attest that this documentation has been prepared under the direction and in the presence of Daniel Crumble, MD. Electronically Signed: Doreatha Wolfe, ED Scribe. 04/01/2015. 12:22 AM.    Chief Complaint  Patient presents with  . Shortness of Breath   The history is provided by the patient. No language interpreter was used.    HPI Comments: Daniel Wolfe is a 65 y.o. male with h/o protein S clotting disorder, GERD, vitamin D deficiency disease who presents to the Emergency Department complaining of moderate SOB onset an hour ago with associated worsened dry cough from baseline, expiratory lung "crackles". He reports that his SOB is worse with exhalation. Pt states he has not taken any breathing treatments tonight. Pt was recently diagnosed with and hospitalized for Pneumonia. He has a chronic suprapubic catheter. Pt is O2 dependent at home on 3L. No h/o CHF. Pt is taking 4.5 mg qd coumadin. Pt is from [facility]. He denies CP, fever.    Past Medical History  Diagnosis Date  . Clotting disorder (HCC)   . Depressed   . Vitamin D deficiency disease   . Constipation   . GERD (gastroesophageal reflux disease)   . Neuropathy (HCC)   . Urinary retention   . Anxiety   . Spinal paraplegia (HCC) 2008  . Neurogenic bladder   . Benign prostate hyperplasia   . Brain aneurysm 2008    s/p coiling   . 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 Cholecystectomy    . Splenectomy     Family History  Problem Relation Age of Onset  . Lung cancer Mother   . Coronary artery disease Father     lung problems/not sure of cause of death   Social History  Substance Use Topics  . Smoking status: Never Smoker   . Smokeless tobacco: Never Used  . Alcohol Use: No    Review of Systems A complete 10 system review of systems was obtained and  all systems are negative except as noted in the HPI and PMH.    Allergies  Penicillins; Senna; and Sulfa antibiotics  Home Medications   Prior to Admission medications   Medication Sig Start Date End Date Taking? Authorizing Provider  baclofen (LIORESAL) 10 MG tablet Take 5 mg by mouth daily.    Historical Provider, MD  bisacodyl (DULCOLAX) 5 MG EC tablet Take 5 mg by mouth daily.     Historical Provider, MD  Cranberry 250 MG CAPS Take 1 capsule by mouth 2 (two) times daily.    Historical Provider, MD  diazepam (VALIUM) 5 MG tablet Take 5 mg by mouth 2 (two) times daily.    Historical Provider, MD  DULoxetine (CYMBALTA) 30 MG capsule Take 30 mg by mouth 3 (three) times daily.     Historical Provider, MD  furosemide (LASIX) 20 MG tablet Take 1 tablet (20 mg total) by mouth 3 (three) times a week. 03/01/15   [name], MD  loperamide (IMODIUM A-D) 2 MG tablet Take 2 mg by mouth as needed for diarrhea or loose stools.    Historical Provider, MD  LORazepam (ATIVAN) 0.5 MG tablet Take 0.5 mg by mouth daily as needed for anxiety (anxiety).     Historical Provider, MD  LYRICA 75 MG capsule Take 75  mg by mouth 2 (two) times daily. 02/02/15   Historical Provider, MD  mirabegron ER (MYRBETRIQ) 50 MG TB24 tablet Take 50 mg by mouth daily.    Historical Provider, MD  mupirocin ointment (BACTROBAN) 2 % Place 1 application into the nose 3 (three) times daily as needed (to supra pubic area to prevent infection).     Historical Provider, MD  ondansetron (ZOFRAN-ODT) 4 MG disintegrating tablet Take 4 mg by mouth every 6 (six) hours as needed for nausea or vomiting.    Historical Provider, MD  POLYETHYLENE GLYCOL 3350 PO Take 17 g/mL by mouth daily.     Historical Provider, MD  potassium chloride SA (K-DUR,KLOR-CON) 20 MEQ tablet Take 1 tablet (20 mEq total) by mouth daily. 03/01/15   [name], MD  QUEtiapine (SEROQUEL) 50 MG tablet Take 50-100 mg by mouth 2 (two) times daily. 50 mg in the am  and 100 mg at bedtime.    Historical Provider, MD  ranitidine (ZANTAC) 150 MG tablet Take 150 mg by mouth 2 (two) times daily.    Historical Provider, MD  tamsulosin (FLOMAX) 0.4 MG CAPS capsule Take 0.4 mg by mouth at bedtime.     Historical Provider, MD  traMADol (ULTRAM) 50 MG tablet Take 50 mg by mouth every 6 (six) hours as needed for moderate pain (pain).     Historical Provider, MD  warfarin (COUMADIN) 6 MG tablet Take 0.5 tablets (3 mg total) by mouth daily. 03/01/15   [name], MD   BP 88/65 mmHg  Pulse 87  Temp(Src) 98.5 F (36.9 C) (Oral)  Resp 12  SpO2 94% Physical Exam  Constitutional: He is oriented to person, place, and time. Vital signs are normal. He appears well-developed and well-nourished.  Non-toxic appearance. He does not appear ill. No distress.  HENT:  Head: Normocephalic and atraumatic.  Nose: Nose normal.  Mouth/Throat: Oropharynx is clear and moist. No oropharyngeal exudate.  Nasal cannula in place.   Eyes: Conjunctivae and EOM are normal. Pupils are equal, round, and reactive to light. No scleral icterus.  Neck: Normal range of motion. Neck supple. No tracheal deviation, no edema, no erythema and normal range of motion present. No thyroid mass and no thyromegaly present.  Cardiovascular: Normal rate, regular rhythm, S1 normal, S2 normal, normal heart sounds, intact distal pulses and normal pulses.  Exam reveals no gallop and no friction rub.   No murmur heard. Pulmonary/Chest: Effort normal. No respiratory distress. He has no wheezes. He has no rhonchi. He has rales.  Bilateral crackles and rales, but worse on the right.   Abdominal: Soft. Normal appearance and bowel sounds are normal. He exhibits no distension, no ascites and no mass. There is no hepatosplenomegaly. There is no tenderness. There is no rebound, no guarding and no CVA tenderness.  Suprapubic catheter in place. Area does not appear infected.   Musculoskeletal: Normal range of motion. He  exhibits no edema or tenderness.  Lymphadenopathy:    He has no cervical adenopathy.  Neurological: He is alert and oriented to person, place, and time. He has normal strength. No cranial nerve deficit or sensory deficit.  Skin: Skin is warm, dry and intact. No petechiae and no rash noted. He is not diaphoretic. No erythema. No pallor.  Psychiatric: He has a normal mood and affect. His behavior is normal. Judgment normal.  Nursing note and vitals reviewed.   ED Course  Procedures (including critical care time) DIAGNOSTIC STUDIES: Oxygen Saturation is 95% on Brush  3L, adequate by my interpretation.    COORDINATION OF CARE: 12:10 AM Discussed treatment plan with pt at bedside and pt agreed to plan.   Labs Review Labs Reviewed  CBC WITH DIFFERENTIAL/PLATELET - Abnormal; Notable for the following:    RDW 16.6 (*)    Platelets 452 (*)    Monocytes Absolute 1.4 (*)    All other components within normal limits  BASIC METABOLIC PANEL - Abnormal; Notable for the following:    Calcium 8.2 (*)    All other components within normal limits  PROTIME-INR - Abnormal; Notable for the following:    Prothrombin Time 28.4 (*)    INR 2.72 (*)    All other components within normal limits  CULTURE, BLOOD (ROUTINE X 2)  CULTURE, BLOOD (ROUTINE X 2)  CULTURE, EXPECTORATED SPUTUM-ASSESSMENT  GRAM STAIN  HIV ANTIBODY (ROUTINE TESTING)  STREP PNEUMONIAE URINARY ANTIGEN  PROTIME-INR  I-STAT CG4 LACTIC ACID, ED    Imaging Review Dg Chest 2 View  04/01/2015  CLINICAL DATA:  Acute onset of shortness of breath. Initial encounter. EXAM: CHEST  2 VIEW COMPARISON:  Chest radiograph from 02/26/2015 FINDINGS: Bibasilar airspace opacities may reflect pneumonia or possibly mild interstitial edema. Small bilateral pleural effusions are suspected. No pneumothorax is seen. The cardiomediastinal silhouette is borderline normal in size. No acute osseous abnormalities are identified. Postoperative change is noted about  the left upper quadrant. IMPRESSION: Bibasilar airspace opacities may reflect pneumonia or possibly mild interstitial edema. Small bilateral pleural effusions suspected. Electronically Signed   By: Roanna Raider M.D.   On: 04/01/2015 01:26   Ct Chest Wo Contrast  04/01/2015  CLINICAL DATA:  65 year old male with pneumonia. EXAM: CT CHEST WITHOUT CONTRAST TECHNIQUE: Multidetector CT imaging of the chest was performed following the standard protocol without IV contrast. COMPARISON:  Radiograph dated 04/01/2015 FINDINGS: Evaluation of this exam is limited in the absence of intravenous contrast. There is an area of consolidative changes with air bronchogram at the right lung base concerning for pneumonia. Bibasilar airspace ground-glass densities may represent atelectasis/pneumonia or chronic changes. There is no pleural effusion or pneumothorax. The central airways are patent. The thoracic aorta and central pulmonary arteries appear unremarkable. There is no cardiomegaly or pericardial effusion. No hilar or mediastinal adenopathy. The esophagus is grossly unremarkable. No thyroid nodules identified. No axillary adenopathy. Chest wall soft tissues appear unremarkable. Bilateral gynecomastia changes noted. Slightly prominent subcutaneous collaterals noted. There is degenerative changes of the spine. No acute fracture. Cholecystectomy. Splenectomy. Cirrhosis. The visualized upper abdomen is otherwise unremarkable. IMPRESSION: Bibasilar ground-glass densities with patchy area of consolidative changes at the right lung base concerning for pneumonia. Clinical correlation and follow-up recommended. Electronically Signed   By: Elgie Collard M.D.   On: 04/01/2015 03:33   I have personally reviewed and evaluated these images and lab results as part of my medical decision-making.   EKG Interpretation   Date/Time:  Saturday April 01 2015 00:01:13 EST Ventricular Rate:  88 PR Interval:  145 QRS Duration: 105 QT  Interval:  385 QTC Calculation: 466 R Axis:   89 Text Interpretation:  Sinus rhythm Borderline right axis deviation No  significant change since last tracing Confirmed by Erroll Luna  [phone]) on 04/01/2015 12:29:38 AM      MDM   Final diagnoses:  None   Patient presents emergency department for evaluation of shortness of breath. Chest x-ray reveals a pneumonia. Patient now has hospital-acquired pneumonia. Blood cultures were drawn, he was given vancomycin cefepime for  treatment. I spoke with Dr. Lanae Boast who accepted the patient for admission. He was given DuoNeb treatment, IV fluids, broad-spectrum antibiotics. Patient will be admitted for further care.   ,I personally performed the services described in this documentation, which was scribed in my presence. The recorded information has been reviewed and is accurate.      Daniel Crumble, MD 04/01/15 [phone]

## 2015-04-01 NOTE — Progress Notes (Signed)
Patient arrived via stretcher alert and oriented, cooperative and voiced no complaints. CHG bath completed, dressing changed on suprapubic cath, no signs of infection noted. Draining clear yellow urine. Lab results unremarkable. Patient stated he is wheelchair bound and he transfers from bed to wheelchair and back to bed. Resides at [facility]. Patient has history of MRSA so precautions initiated. Antibiotic infusing via pump into PIV in left shoulder. Will continue to monitor closely.

## 2015-04-01 NOTE — H&P (Signed)
[facility] History and Physical  Daniel Wolfe JXB:[id number] DOB: [birth year] DOA: 04/01/2015  Referring physician: EDP PCP: Arnette Felts   Chief Complaint: SOB   HPI: Daniel Wolfe is a 65 y.o. male with h/o protein S clotting disorder.  Patient presents to the ED with c/o moderate SOB that onset today about 1 hr PTA.  This has been associated with worsening dry non-productive cough.  Recently hospitalized in January for HCAP.  O2 dependent at baseline on 3L at baseline.  No h/o CHF he reports.  Is taking coumadin for the clotting disorder.  Last month 2D echo showed diastolic dysfunction with nl EF.  He denies CP, no fever, does have chills.  Review of Systems: Systems reviewed.  As above, otherwise negative  Past Medical History  Diagnosis Date  . Clotting disorder (HCC)   . Depressed   . Vitamin D deficiency disease   . Constipation   . GERD (gastroesophageal reflux disease)   . Neuropathy (HCC)   . Urinary retention   . Anxiety   . Spinal paraplegia (HCC) 2008  . Neurogenic bladder   . Benign prostate hyperplasia   . Brain aneurysm 2008    s/p coiling   . 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 Cholecystectomy    . Splenectomy     Social History:  reports that he has never smoked. He has never used smokeless tobacco. He reports that he does not drink alcohol or use illicit drugs.  Allergies  Allergen Reactions  . Penicillins Other (See Comments)    Unknown on MAR  . Senna Nausea And Vomiting  . Sulfa Antibiotics Other (See Comments)    Unknown on MAR    Family History  Problem Relation Age of Onset  . Lung cancer Mother   . Coronary artery disease Father     lung problems/not sure of cause of death     Prior to Admission medications   Medication Sig Start Date End Date Taking? Authorizing Provider  baclofen (LIORESAL) 10 MG tablet Take 5 mg by mouth daily.   Yes Historical  Provider, MD  bisacodyl (DULCOLAX) 5 MG EC tablet Take 5 mg by mouth daily.    Yes Historical Provider, MD  Cranberry 250 MG CAPS Take 1 capsule by mouth 2 (two) times daily.   Yes Historical Provider, MD  diazepam (VALIUM) 5 MG tablet Take 5 mg by mouth 2 (two) times daily.   Yes Historical Provider, MD  DULoxetine (CYMBALTA) 30 MG capsule Take 30 mg by mouth 3 (three) times daily.    Yes Historical Provider, MD  guaiFENesin (MUCINEX) 600 MG 12 hr tablet Take 600 mg by mouth 2 (two) times daily as needed for cough.   Yes Historical Provider, MD  loperamide (IMODIUM A-D) 2 MG tablet Take 2 mg by mouth as needed for diarrhea or loose stools.   Yes Historical Provider, MD  LYRICA 75 MG capsule Take 75 mg by mouth 2 (two) times daily. 02/02/15  Yes Historical Provider, MD  mirabegron ER (MYRBETRIQ) 50 MG TB24 tablet Take 50 mg by mouth daily.   Yes Historical Provider, MD  polyethylene glycol (MIRALAX / GLYCOLAX) packet Take 17 g by mouth daily.   Yes Historical Provider, MD  potassium chloride SA (K-DUR,KLOR-CON) 20 MEQ tablet Take 1 tablet (20 mEq total) by mouth daily. 03/01/15  Yes [name], MD  PRESCRIPTION MEDICATION Take by  mouth 2 (two) times daily. Swish and Swallow Viscous Lidocaine 2%/Nystatin Susp   Yes Historical Provider, MD  QUEtiapine (SEROQUEL) 50 MG tablet Take 50-100 mg by mouth 2 (two) times daily. 50 mg in the am and 100 mg at bedtime.   Yes Historical Provider, MD  ranitidine (ZANTAC) 150 MG tablet Take 150 mg by mouth 2 (two) times daily.   Yes Historical Provider, MD  sertraline (ZOLOFT) 100 MG tablet Take 100 mg by mouth daily.   Yes Historical Provider, MD  warfarin (COUMADIN) 2 MG tablet Take 2 mg by mouth daily. Take with Coumadin 2.5mg    Yes Historical Provider, MD  warfarin (COUMADIN) 2.5 MG tablet Take 2.5 mg by mouth daily. Take with coumadin    Yes Historical Provider, MD   Physical Exam: Filed Vitals:   04/01/15 0145 04/01/15 0215  BP: 88/56 86/61   Pulse: 84 85  Temp:    Resp: 15 14    BP 86/61 mmHg  Pulse 85  Temp(Src) 98.5 F (36.9 C) (Oral)  Resp 14  SpO2 97%  General Appearance:    Alert, oriented, no distress, appears stated age  Head:    Normocephalic, atraumatic  Eyes:    PERRL, EOMI, sclera non-icteric        Nose:   Nares without drainage or epistaxis. Mucosa, turbinates normal  Throat:   Moist mucous membranes. Oropharynx without erythema or exudate.  Neck:   Supple. No carotid bruits.  No thyromegaly.  No lymphadenopathy.   Back:     No CVA tenderness, no spinal tenderness  Lungs:     Clear to auscultation bilaterally, without wheezes, rhonchi or rales  Chest wall:    No tenderness to palpitation  Heart:    Regular rate and rhythm without murmurs, gallops, rubs  Abdomen:     Soft, non-tender, nondistended, normal bowel sounds, no organomegaly  Genitalia:    deferred  Rectal:    deferred  Extremities:   No clubbing, cyanosis or edema.  Pulses:   2+ and symmetric all extremities  Skin:   Skin color, texture, turgor normal, no rashes or lesions  Lymph nodes:   Cervical, supraclavicular, and axillary nodes normal  Neurologic:   CNII-XII intact. Normal strength, sensation and reflexes      throughout    Labs on Admission:  Basic Metabolic Panel:  Recent Labs Lab 04/01/15 0108  NA 140  K 4.3  CL 103  CO2 26  GLUCOSE 99  BUN 6  CREATININE 1.09  CALCIUM 8.2*   Liver Function Tests: No results for input(s): AST, ALT, ALKPHOS, BILITOT, PROT, ALBUMIN in the last 168 hours. No results for input(s): LIPASE, AMYLASE in the last 168 hours. No results for input(s): AMMONIA in the last 168 hours. CBC:  Recent Labs Lab 04/01/15 0108  WBC 10.1  NEUTROABS 5.3  HGB 13.1  HCT 41.0  MCV 96.0  PLT 452*   Cardiac Enzymes: No results for input(s): CKTOTAL, CKMB, CKMBINDEX, TROPONINI in the last 168 hours.  BNP (last 3 results) No results for input(s): PROBNP in the last 8760 hours. CBG: No results for  input(s): GLUCAP in the last 168 hours.  Radiological Exams on Admission: Dg Chest 2 View  04/01/2015  CLINICAL DATA:  Acute onset of shortness of breath. Initial encounter. EXAM: CHEST  2 VIEW COMPARISON:  Chest radiograph from 02/26/2015 FINDINGS: Bibasilar airspace opacities may reflect pneumonia or possibly mild interstitial edema. Small bilateral pleural effusions are suspected. No pneumothorax is seen. The cardiomediastinal  silhouette is borderline normal in size. No acute osseous abnormalities are identified. Postoperative change is noted about the left upper quadrant. IMPRESSION: Bibasilar airspace opacities may reflect pneumonia or possibly mild interstitial edema. Small bilateral pleural effusions suspected. Electronically Signed   By: Roanna Raider M.D.   On: 04/01/2015 01:26    EKG: Independently reviewed.  Assessment/Plan Principal Problem:   HCAP (healthcare-associated pneumonia) Active Problems:   Hypotension   1. HCAP - 1. B infiltrates on CXR once again 2. CT chest ordered to further evaluate these due to their presence for the past 1 month now 3. DDx includes mild pulmonary edema and COP 4. Will treat for PNA for now 5. PNA pathway 6. Cefepime and vanc 7. If CT chest is not diagnostic, consider pulm consult for diagnostic bronchoscopy at this point. 2. Hypotension - 1. ? Sepsis due to PNA 2. Holding diuretics and giving IVF 3. Does not appear toxic at this point despite hypotension and is mentation normally    Code Status: DNR  Family Communication: No family in room Disposition Plan: Admit to inpatient   Time spent: 70 min  GARDNER, JARED M. [facility] Pager [phone]  If 7AM-7PM, please contact the day team taking care of the patient [email or web address] Password [email or web address] 04/01/2015, 2:35 AM

## 2015-04-02 DIAGNOSIS — D689 Coagulation defect, unspecified: Secondary | ICD-10-CM

## 2015-04-02 DIAGNOSIS — J189 Pneumonia, unspecified organism: Principal | ICD-10-CM

## 2015-04-02 DIAGNOSIS — I959 Hypotension, unspecified: Secondary | ICD-10-CM

## 2015-04-02 DIAGNOSIS — F329 Major depressive disorder, single episode, unspecified: Secondary | ICD-10-CM

## 2015-04-02 LAB — CBC
HEMATOCRIT: 40.7 % (ref 39.0–52.0)
Hemoglobin: 12.5 g/dL — ABNORMAL LOW (ref 13.0–17.0)
MCH: 29.4 pg (ref 26.0–34.0)
MCHC: 30.7 g/dL (ref 30.0–36.0)
MCV: 95.8 fL (ref 78.0–100.0)
PLATELETS: 534 10*3/uL — AB (ref 150–400)
RBC: 4.25 MIL/uL (ref 4.22–5.81)
RDW: 16.8 % — ABNORMAL HIGH (ref 11.5–15.5)
WBC: 9.7 10*3/uL (ref 4.0–10.5)

## 2015-04-02 LAB — BASIC METABOLIC PANEL
ANION GAP: 8 (ref 5–15)
BUN: 6 mg/dL (ref 6–20)
CHLORIDE: 110 mmol/L (ref 101–111)
CO2: 23 mmol/L (ref 22–32)
Calcium: 8.3 mg/dL — ABNORMAL LOW (ref 8.9–10.3)
Creatinine, Ser: 0.83 mg/dL (ref 0.61–1.24)
GFR calc Af Amer: >60 mL/min (ref >60)
GFR calc non Af Amer: >60 mL/min (ref >60)
GLUCOSE: 83 mg/dL (ref 65–99)
POTASSIUM: 4.2 mmol/L (ref 3.5–5.1)
Sodium: 141 mmol/L (ref 135–145)

## 2015-04-02 LAB — PROTIME-INR
INR: 2.88 — ABNORMAL HIGH (ref 0.00–1.49)
Prothrombin Time: 29.7 seconds — ABNORMAL HIGH (ref 11.6–15.2)

## 2015-04-02 NOTE — Progress Notes (Signed)
PROGRESS NOTE    Daniel Wolfe  ZOX:[id number]  DOB: [birth year]  DOA: 04/01/2015 PCP: Arnette Felts Outpatient Specialists:   Hospital course: 65 year old male, ALF resident, PMH of protein S clotting disorder, on chronic Coumadin anticoagulation, depression, GERD, BPH, neurogenic bladder, urinary retention with indwelling Foley catheter followed by [facility], sent to the ED on 04/01/15 for complaints of dyspnea and nonproductive cough. Recently hospitalized in January for healthcare associated pneumonia and has since been oxygen dependent at 3 L/m. CT chest showed bibasilar groundglass densities with patchy area of consolidative changes at the right lung base concerning for pneumonia. Treating for persistent/recurrent HCAP. Improving. Possible DC back to ALF on 2/13.  Assessment & Plan:   1. Healthcare associated pneumonia: Recurrent versus persistent. CT chest showed bibasilar groundglass densities with patchy areas of consolidative changes at the right lung base concerning for pneumonia. Patient was afebrile but hypotensive in the ED. Treated with IV fluids, IV cefepime and vancomycin. Urinary streptococcal antigen negative. Pro-calcitonin <0.1. MRSA PCR: Negative. HIV antibodies: Nonreactive. Sputum culture unfortunately was not sent. Blood cultures 2: Negative to date. Clinically improved. DC vancomycin. Speech therapy recommends regular diet and thin liquids. Continue IV cefepime for additional 24 hours and then transitioned to oral antibiotics at discharge. We will need follow-up CT chest in 3-4 weeks to ensure resolution of pneumonia findings and if persistent, recommend outpatient pulmonology consultation. 2. Hypercoagulable state secondary to protein S deficiency: Anticoagulated on Coumadin per pharmacy. 3. BPH/neurogenic bladder/urinary retention with chronic indwelling Foley catheter: Follows with [facility]. Change Foley catheter prior to discharge. 4. Anxiety &  depression: Continue home medications. 5. Chronic respiratory failure with hypoxia: Continue oxygen supplementation and titrate to maintain sats >92%.  DVT prophylaxis: Anticoagulated on Coumadin. Code Status: DO NOT RESUSCITATE. Family Communication: None at bedside. Disposition Plan: Transfer to medical floor 2/12. Possible DC to ALF on 2/13.    Consultants:  None  Procedures:  Chronic indwelling Foley catheter-was present prior to admission.  Antimicrobials:  IV cefepime 2/10 >  IV vancomycin 2/10 >  Subjective: Complaints of chronic sore throat since early January. Denies dyspnea or cough. As per RN, no acute issues.  Objective: Filed Vitals:   04/02/15 0749 04/02/15 0800 04/02/15 1137 04/02/15 1200  BP: 140/91 135/80 131/80 140/90  Pulse: 78 88 82 81  Temp: 98 F (36.7 C)  97.7 F (36.5 C)   TempSrc: Oral  Oral   Resp: 13 19 14 18   Height:      Weight:      SpO2: 98% 95% 94% 92%    Intake/Output Summary (Last 24 hours) at 04/02/15 1331 Last data filed at 04/02/15 1103  Gross per 24 hour  Intake   3150 ml  Output    950 ml  Net   2200 ml   Filed Weights   04/01/15 0458  Weight: 96.2 kg (212 lb 1.3 oz)    Exam:  General exam: Moderately built and nourished pleasant middle-aged male lying comfortably supine in bed. Respiratory system: Occasional basal crackles, right >left but otherwise clear to auscultation. No increased work of breathing. Cardiovascular system: S1 & S2 heard, RRR. No JVD, murmurs, gallops, clicks or pedal edema. Telemetry: Sinus rhythm. Gastrointestinal system: Abdomen is nondistended, soft and nontender. Normal bowel sounds heard. Indwelling Foley catheter. Central nervous system: Alert and oriented. No focal neurological deficits. Extremities: Symmetric 5 x 5 power.   Data Reviewed: Basic Metabolic Panel:  Recent Labs Lab 04/01/15 0108 04/02/15 0533  NA 140  141  K 4.3 4.2  CL 103 110  CO2 26 23  GLUCOSE 99 83  BUN 6 6    CREATININE 1.09 0.83  CALCIUM 8.2* 8.3*   Liver Function Tests: No results for input(s): AST, ALT, ALKPHOS, BILITOT, PROT, ALBUMIN in the last 168 hours. No results for input(s): LIPASE, AMYLASE in the last 168 hours. No results for input(s): AMMONIA in the last 168 hours. CBC:  Recent Labs Lab 04/01/15 0108 04/02/15 0533  WBC 10.1 9.7  NEUTROABS 5.3  --   HGB 13.1 12.5*  HCT 41.0 40.7  MCV 96.0 95.8  PLT 452* 534*   Cardiac Enzymes: No results for input(s): CKTOTAL, CKMB, CKMBINDEX, TROPONINI in the last 168 hours. BNP (last 3 results) No results for input(s): PROBNP in the last 8760 hours. CBG: No results for input(s): GLUCAP in the last 168 hours.  Recent Results (from the past 240 hour(s))  Culture, blood (Routine X 2) w Reflex to ID Panel     Status: None (Preliminary result)   Collection Time: 04/01/15  2:10 AM  Result Value Ref Range Status   Specimen Description BLOOD LEFT HAND  Final   Special Requests IN PEDIATRIC BOTTLE  Final   Culture NO GROWTH 1 DAY  Final   Report Status PENDING  Incomplete  Culture, blood (Routine X 2) w Reflex to ID Panel     Status: None (Preliminary result)   Collection Time: 04/01/15  2:15 AM  Result Value Ref Range Status   Specimen Description BLOOD RIGHT HAND  Final   Special Requests IN PEDIATRIC BOTTLE  Final   Culture NO GROWTH 1 DAY  Final   Report Status PENDING  Incomplete  MRSA PCR Screening     Status: None   Collection Time: 04/01/15  4:48 AM  Result Value Ref Range Status   MRSA by PCR NEGATIVE NEGATIVE Final    Comment:        The GeneXpert MRSA Assay (FDA approved for NASAL specimens only), is one component of a comprehensive MRSA colonization surveillance program. It is not intended to diagnose MRSA infection nor to guide or monitor treatment for MRSA infections.          Studies: Dg Chest 2 View  04/01/2015  CLINICAL DATA:  Acute onset of shortness of breath. Initial encounter. EXAM: CHEST   2 VIEW COMPARISON:  Chest radiograph from 02/26/2015 FINDINGS: Bibasilar airspace opacities may reflect pneumonia or possibly mild interstitial edema. Small bilateral pleural effusions are suspected. No pneumothorax is seen. The cardiomediastinal silhouette is borderline normal in size. No acute osseous abnormalities are identified. Postoperative change is noted about the left upper quadrant. IMPRESSION: Bibasilar airspace opacities may reflect pneumonia or possibly mild interstitial edema. Small bilateral pleural effusions suspected. Electronically Signed   By: Roanna Raider M.D.   On: 04/01/2015 01:26   Ct Chest Wo Contrast  04/01/2015  CLINICAL DATA:  65 year old male with pneumonia. EXAM: CT CHEST WITHOUT CONTRAST TECHNIQUE: Multidetector CT imaging of the chest was performed following the standard protocol without IV contrast. COMPARISON:  Radiograph dated 04/01/2015 FINDINGS: Evaluation of this exam is limited in the absence of intravenous contrast. There is an area of consolidative changes with air bronchogram at the right lung base concerning for pneumonia. Bibasilar airspace ground-glass densities may represent atelectasis/pneumonia or chronic changes. There is no pleural effusion or pneumothorax. The central airways are patent. The thoracic aorta and central pulmonary arteries appear unremarkable. There is no cardiomegaly or pericardial effusion.  No hilar or mediastinal adenopathy. The esophagus is grossly unremarkable. No thyroid nodules identified. No axillary adenopathy. Chest wall soft tissues appear unremarkable. Bilateral gynecomastia changes noted. Slightly prominent subcutaneous collaterals noted. There is degenerative changes of the spine. No acute fracture. Cholecystectomy. Splenectomy. Cirrhosis. The visualized upper abdomen is otherwise unremarkable. IMPRESSION: Bibasilar ground-glass densities with patchy area of consolidative changes at the right lung base concerning for pneumonia.  Clinical correlation and follow-up recommended. Electronically Signed   By: Elgie Collard M.D.   On: 04/01/2015 03:33        Scheduled Meds: . bisacodyl  5 mg Oral Daily  . ceFEPime (MAXIPIME) IV  2 g Intravenous 3 times per day  . diazepam  5 mg Oral BID  . DULoxetine  30 mg Oral TID  . polyethylene glycol  17 g Oral Daily  . pregabalin  75 mg Oral BID  . QUEtiapine  100 mg Oral QHS  . QUEtiapine  50 mg Oral Daily  . sertraline  100 mg Oral Daily  . warfarin  4.5 mg Oral q1800  . Warfarin - Pharmacist Dosing Inpatient   Does not apply q1800   Continuous Infusions:    Principal Problem:   HCAP (healthcare-associated pneumonia) Active Problems:   Hypotension   Clotting disorder (HCC)   Depression    Time spent: 25 minutes.    Marcellus Scott, MD, FACP, FHM. [facility] Pager [phone]  If 7PM-7AM, please contact night-coverage [email or web address] Password [email or web address] 04/02/2015, 1:31 PM    LOS: 1 day

## 2015-04-02 NOTE — Progress Notes (Signed)
ANTICOAGULATION CONSULT NOTE - Follow Up Consult  Pharmacy Consult for Coumadin  Indication: atrial fibrillation and Protein S clotting disorder  Allergies  Allergen Reactions  . Penicillins Other (See Comments)    Unknown on MAR  . Senna Nausea And Vomiting  . Sulfa Antibiotics Other (See Comments)    Unknown on [facility]    Patient Measurements: Height:  (177.8 cm) Weight: 212 lb 1.3 oz (96.2 kg) IBW/kg (Calculated) : 73  Vital Signs: Temp: 98 F (36.7 C) (02/12 0749) Temp Source: Oral (02/12 0749) BP: 140/91 mmHg (02/12 0749) Pulse Rate: 78 (02/12 0749)  Labs:  Recent Labs  04/01/15 0108 04/01/15 0331 04/02/15 0533  HGB 13.1  --  12.5*  HCT 41.0  --  40.7  PLT 452*  --  534*  LABPROT 28.4* 27.0* 29.7*  INR 2.72* 2.54* 2.88*  CREATININE 1.09  --  0.83    Estimated Creatinine Clearance: 107.4 mL/min (by C-G formula based on Cr of 0.83).  Assessment: Anticoag/Heme: Coumadin PTA for protein S deficiency. INR on admit (2.25) now 2.88. Hgb 12.5, PLT 534, no bleeding noted   PTA dose = 4.5mg  daily (last dose PTA 2/10)  Goal of Therapy:  INR 2-3 Monitor platelets by anticoagulation protocol: Yes   Plan:  Continue home dose of coumadin 4.5mg  daily Daily/INR CBC for now  Monitor for s/s of bleeding   Aymar Whitfill C. Marvis Moeller, PharmD Pharmacy Resident  Pager: [phone] 04/02/2015 9:05 AM

## 2015-04-02 NOTE — Progress Notes (Signed)
Report called to [facility] RN. Pt made aware of move. Belongings collected and will go with pt.

## 2015-04-03 LAB — PROTIME-INR
INR: 3.17 — ABNORMAL HIGH (ref 0.00–1.49)
PROTHROMBIN TIME: 31.9 s — AB (ref 11.6–15.2)

## 2015-04-03 LAB — PROCALCITONIN: Procalcitonin: <0.1 ng/mL

## 2015-04-03 MED ORDER — LEVOFLOXACIN 750 MG PO TABS
750.0000 mg | ORAL_TABLET | Freq: Every day | ORAL | Status: DC
  Administered 2015-04-03: 750 mg via ORAL
  Filled 2015-04-03: qty 1

## 2015-04-03 MED ORDER — LEVOFLOXACIN 750 MG PO TABS
750.0000 mg | ORAL_TABLET | Freq: Every day | ORAL | Status: AC
Start: 2015-04-04 — End: 2015-04-06

## 2015-04-03 MED ORDER — WARFARIN SODIUM 2 MG PO TABS: 4.0000 mg | ORAL_TABLET | Freq: Every day | ORAL | Status: AC

## 2015-04-03 NOTE — Progress Notes (Signed)
Speech Language Pathology Treatment: Dysphagia  Patient Details Name: Daniel Wolfe MRN: [id number] DOB: 13-Oct-1950 Today's Date: 04/03/2015 Time: 3601-6580 SLP Time Calculation (min) (ACUTE ONLY): 12 min  Assessment / Plan / Recommendation Clinical Impression  Pt consumed regular textures and thin liquids with no overt signs of aspiration. He is edentulous and therefore has prolonged mastication of solids, which he reports is baseline. He utilizes a liquid wash to soften foods with Mod I. Recommend to continue current diet, allowing pt to select foods that he feels comfortable chewing. No further SLP f/u indicated.   HPI HPI: 65 y.o. male with h/o protein S clotting disorder, GERD, vitamin D deficiency disease who presents to the Emergency Department complaining of moderate SOB. Pt was recently hospitalized for PNA and CT Chest performed 2/11 is cocnerning for RLL PNA.      SLP Plan  All goals met     Recommendations  Diet recommendations: Regular;Thin liquid Liquids provided via: Cup;Straw Medication Administration: Whole meds with liquid Supervision: Patient able to self feed;Intermittent supervision to cue for compensatory strategies Compensations: Slow rate;Small sips/bites Postural Changes and/or Swallow Maneuvers: Seated upright 90 degrees             Oral Care Recommendations: Oral care BID Follow up Recommendations: None Plan: All goals met     GO               Germain Osgood, M.A. CCC-SLP [phone]  Germain Osgood 04/03/2015, 10:19 AM

## 2015-04-03 NOTE — Progress Notes (Addendum)
CSW has arranged transportation via [facility] for patient to return back to facility around 3:30pm. RN is aware of transport time. CSW to inform patient. DNR form in chart and needs to be signed by MD. CSW to sign off.   Please consult if further CSW needs arise.   Fernande Boyden, LCSWA Clinical Social Worker [facility] Ph: [phone]

## 2015-04-03 NOTE — Progress Notes (Signed)
Pt scheduled for discharge to [facility] this pm. Transport should be picking up around 1530hrs

## 2015-04-03 NOTE — [place] FL2 (Signed)
[place] MEDICAID FL2 LEVEL OF CARE SCREENING TOOL     IDENTIFICATION  [name] Birthdate: Jan 26, 1951 Sex: male Admission Date (Current Location): 04/01/2015  [facility] and [place] Number:  Producer, television/film/video and Address:  [facility] [place], [address], [place], [place] [address]      Provider Number: [id number]  Attending Physician Name and Address:  Elease Etienne, MD  Relative Name and Phone Number:       Current Level of Care: Hospital Recommended Level of Care: Assisted Living Facility Prior Approval Number:    Date Approved/Denied:   PASRR Number:    Discharge Plan: Other (Comment) ([facility])    Current Diagnoses: Patient Active Problem List   Diagnosis Date Noted  . Depression 04/01/2015  . Chronic anticoagulation-Coumadin 02/26/2015  . Clotting disorder (HCC) 02/26/2015  . Abnormal EKG- new TWI 02/26/2015  . Hypokalemia 02/26/2015  . Dyspnea   . Hypotension 02/25/2015  . Respiratory distress   . HCAP (healthcare-associated pneumonia) 02/23/2015  . Oxygen desaturation   . Acute pulmonary edema (HCC)   . Pneumonia   . Epiglottitis 02/20/2015  . Acute pharyngitis 02/20/2015  . Sore throat 02/20/2015  . S/P cerebral aneurysm coiling 2008 08/23/2013  . SAH (subarachnoid hemorrhage) 2008 08/23/2013  . Paraplegia (HCC) 08/23/2013    Orientation RESPIRATION BLADDER Height & Weight     Self, Time, Situation, Place  O2 (2 Liters ) Continent Weight: 212 lb 1.3 oz (96.2 kg) Height:   (177.8 cm)  BEHAVIORAL SYMPTOMS/MOOD NEUROLOGICAL BOWEL NUTRITION STATUS      Continent Diet (heart )  AMBULATORY STATUS COMMUNICATION OF NEEDS Skin   Limited Assist Verbally Normal                       Personal Care Assistance Level of Assistance  Bathing, Feeding, Dressing Bathing Assistance: Limited assistance Feeding assistance: Independent Dressing Assistance: Limited assistance     Functional Limitations Info   Sight, Hearing, Speech Sight Info: Adequate Hearing Info: Impaired Speech Info: Adequate    SPECIAL CARE FACTORS FREQUENCY  PT (By licensed PT), OT (By licensed OT)                    Contractures      Additional Factors Info  Code Status, Allergies Code Status Info: DNR Allergies Info: Penicillins, Senna, Sulfa Antibiotics           Current Medications (04/03/2015):  This is the current hospital active medication list Current Facility-Administered Medications  Medication Dose Route Frequency Provider Last Rate Last Dose  . bisacodyl (DULCOLAX) EC tablet 5 mg  5 mg Oral Daily Hillary Bow, DO   5 mg at 04/03/15 0941  . ceFEPIme (MAXIPIME) 2 g in dextrose 5 % 50 mL IVPB  2 g Intravenous 3 times per day Juliette Mangle, RPH   2 g at 04/03/15 0542  . diazepam (VALIUM) tablet 5 mg  5 mg Oral BID Hillary Bow, DO   5 mg at 04/03/15 0941  . DULoxetine (CYMBALTA) DR capsule 30 mg  30 mg Oral TID Hillary Bow, DO   30 mg at 04/03/15 0940  . guaiFENesin (MUCINEX) 12 hr tablet 600 mg  600 mg Oral BID PRN Hillary Bow, DO      . menthol-cetylpyridinium (CEPACOL) lozenge 3 mg  1 lozenge Oral PRN Roma Kayser Schorr, NP      . polyethylene glycol (  MIRALAX / GLYCOLAX) packet 17 g  17 g Oral Daily Hillary Bow, DO   17 g at 04/01/15 8657  . pregabalin (LYRICA) capsule 75 mg  75 mg Oral BID Hillary Bow, DO   75 mg at 04/03/15 0941  . QUEtiapine (SEROQUEL) tablet 100 mg  100 mg Oral QHS Hillary Bow, DO   100 mg at 04/02/15 2224  . QUEtiapine (SEROQUEL) tablet 50 mg  50 mg Oral Daily Hillary Bow, DO   50 mg at 04/03/15 0941  . sertraline (ZOLOFT) tablet 100 mg  100 mg Oral Daily Hillary Bow, DO   100 mg at 04/03/15 0941  . warfarin (COUMADIN) tablet 4.5 mg  4.5 mg Oral q1800 Juliette Mangle, RPH   4.5 mg at 04/02/15 1824  . Warfarin - Pharmacist Dosing Inpatient   Does not apply q1800 Juliette Mangle, [facility]         Discharge Medications: Please see discharge  summary for a list of discharge medications.  Relevant Imaging Results:  Relevant Lab Results:   Additional Information SSN: [id number]  Loleta Dicker, LCSW

## 2015-04-03 NOTE — Care Management Note (Signed)
Case Management Note  Patient Details  Name: HILMAR MOLDOVAN MRN: [id number] Date of Birth: 07-01-1950  Subjective/Objective:                    Action/Plan:   Expected Discharge Date:                Expected Discharge Plan:  Assisted Living / Rest Home  In-House Referral:  Clinical Social Work  Discharge planning Services     Post Acute Care Choice:    Choice offered to:     DME Arranged:    DME Agency:     HH Arranged:    HH Agency:     Status of Service:  Completed, signed off  Medicare Important Message Given:    Date Medicare IM Given:    Medicare IM give by:    Date Additional Medicare IM Given:    Additional Medicare Important Message give by:     If discussed at Long Length of Stay Meetings, dates discussed:    Additional Comments:UR updated   Kingsley Plan, RN 04/03/2015, 11:02 AM

## 2015-04-03 NOTE — Discharge Instructions (Signed)

## 2015-04-03 NOTE — Progress Notes (Signed)
ANTICOAGULATION CONSULT NOTE - Follow Up Consult  Pharmacy Consult for Coumadin  Indication: atrial fibrillation and Protein S clotting disorder  Allergies  Allergen Reactions  . Penicillins Other (See Comments)    Unknown on MAR  . [facility]  . Sulfa Antibiotics Other (See Comments)    Unknown on MAR    Patient Measurements: Height:  (177.8 cm) Weight: 212 lb 1.3 oz (96.2 kg) IBW/kg (Calculated) : 73  Vital Signs: Temp: 98.5 F (36.9 C) (02/13 0441) Temp Source: Oral (02/13 0441) BP: 121/71 mmHg (02/13 0441) Pulse Rate: 93 (02/13 0441)  Labs:  Recent Labs  04/01/15 0108 04/01/15 0331 04/02/15 0533 04/03/15 0542  HGB 13.1  --  12.5*  --   HCT 41.0  --  40.7  --   PLT 452*  --  534*  --   LABPROT 28.4* 27.0* 29.7* 31.9*  INR 2.72* 2.54* 2.88* 3.17*  CREATININE 1.09  --  0.83  --     Estimated Creatinine Clearance: 107.4 mL/min (by C-G formula based on Cr of 0.83).  Assessment: 65 yo M presents on 2/11. On Coumadin 4.5mg  daily PTA for protein S deficiency / AFib. INR on admit 2.72, now up to 3.17. Hgb ok at 12.5, plts 534. Started on Levaquin on 2/13  Goal of Therapy:  INR 2-3 Monitor platelets by anticoagulation protocol: Yes   Plan:  Hold coumadin dose today Monitor daily INR, CBC, s/s of bleed  Enzo Bi, PharmD, [facility] Clinical Pharmacist Pager [phone] 04/03/2015 12:37 PM

## 2015-04-03 NOTE — Progress Notes (Signed)
CSW contacted facility to inform that patient is medically stable and ready for discharge on today. Per facility representative, patient can return at anytime today. FL2 and DC Summary to be faxed to the facility at [phone]. CSW will arrange transportation back to [facility] via [place]. CSW to inform RN and patient of transfer time once ready.   Fernande Boyden, LCSWA Clinical Social Worker [facility] Ph: [phone]

## 2015-04-03 NOTE — Discharge Summary (Signed)
Physician Discharge Summary  Daniel Wolfe  ZOX:[id number]  DOB: Apr 19, 1950  DOA: 04/01/2015  PCP: Arnette Felts  Admit date: 04/01/2015 Discharge date: 04/03/2015  Time spent: Greater than 30 minutes  Recommendations for Outpatient Follow-up:  1. M.D. at assisted living facility on 04/05/15-to be seen with repeat labs (PT & INR). Adjust Coumadin dose as needed. Please follow final blood culture results that were sent from the hospital. 2. Arnette Felts, PCP in 1 week. 3. [facility]: ALF to arrange follow-up appointment for this week to have his suprapubic catheter changed. 4. Recommend outpatient ENT consultation for evaluation of chronic throat pain. 5. Consider outpatient pulmonology consultation for evaluation of persistent findings on CT chest.  Discharge Diagnoses:  Principal Problem:   HCAP (healthcare-associated pneumonia) Active Problems:   Hypotension   Clotting disorder (HCC)   Depression   Discharge Condition: Improved & Stable  Diet recommendation: Heart healthy diet.  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 Recently hospitalized in January for healthcare associated pneumonia and has since been oxygen dependent at 3 L/m. CT chest showed bibasilar groundglass densities with patchy area of consolidative changes at the right lung base concerning for pneumonia. Treating for persistent/recurrent HCAP.   Hospital Course:    1. Healthcare associated pneumonia: Recurrent versus persistent. CT chest showed bibasilar groundglass densities with patchy areas of consolidative changes at the right lung base  concerning for pneumonia. Patient was afebrile but hypotensive in the ED. Treated with IV fluids, IV cefepime and vancomycin. Urinary streptococcal antigen negative. Pro-calcitonin <0.1. MRSA PCR: Negative. HIV antibodies: Nonreactive. Sputum culture unfortunately was not sent. Blood cultures 2: Negative to date. Clinically improved. DC vancomycin. Speech therapy recommends regular diet and thin liquids. Consider outpatient pulmonology consultation for evaluation of persistent findings on CT chest. Completed 3 days of IV antibiotics. We will transition to oral levofloxacin to complete total one-week antibiotics. 2. Hypercoagulable state secondary to protein S deficiency: Anticoagulated on Coumadin per pharmacy. INR has been gradually this 3.17 today. Will slightly reduce home Coumadin dose from 4.5 daily to 4 MG daily and monitor INR closely while he is on short course of levofloxacin. Adjust Coumadin dose as needed. 3. 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 4. Anxiety & depression: Continue home medications. Patient on polypharmacy which can be closely reviewed and adjusted as outpatient as deemed necessary. 5. Chronic respiratory failure with hypoxia: Continue oxygen supplementation and titrate to maintain sats >92%. 6. DO NOT RESUSCITATE 7. Throat pain: Unclear etiology. Seems chronic. No acute findings on direct exam. Recommend outpatient ENT consultation. May continue prior to admission oral nystatin for now.   Consultants:  None  Procedures:  Chronic indwelling suprapubic catheter-was present prior to admission.  Antimicrobials:  IV cefepime 2/10 > 2/12  IV vancomycin 2/10 > discontinued  PO levofloxacin 2/13 > 2/16  Discharge Exam:  Complaints: Complaints of chronic intermittent throat pain. Denies any other complaints. No dyspnea, cough or chest  pain.  Filed Vitals:   04/02/15 1614 04/02/15 1736 04/02/15 2125 04/03/15 0441  BP: 134/80 136/88 126/77 121/71  Pulse: 93 83 86 93  Temp: 98.2 F (36.8 C) 98.1 F (36.7 C) 98 F (36.7 C) 98.5 F (36.9 C)  TempSrc: Oral Oral Oral Oral  Resp: 10 17 16 17   Height:      Weight:      SpO2: 99% 6% 97% 95%    General exam: Moderately built and nourished pleasant middle-aged male lying comfortably supine in bed. ENT: Direct throat exam without acute abnormalities. No thrush seen. Respiratory system: Occasional basal crackles, right >left but otherwise clear to auscultation. No increased work of breathing. Cardiovascular system: S1 & S2 heard, RRR. No JVD, murmurs, gallops, clicks or pedal edema.  Gastrointestinal system: Abdomen is nondistended, soft and nontender. Normal bowel sounds heard. Indwelling suprapubic catheter. Central nervous system: Alert and oriented. No focal neurological deficits. Extremities: Symmetric 5 x 5 power.  Discharge Instructions      Discharge Instructions    Call MD for:  difficulty breathing, headache or visual disturbances    Complete by:  As directed      Call MD for:  extreme fatigue    Complete by:  As directed      Call MD for:  persistant dizziness or light-headedness    Complete by:  As directed      Call MD for:  persistant nausea and vomiting    Complete by:  As directed      Call MD for:  severe uncontrolled pain    Complete by:  As directed      Call MD for:  temperature >100.4    Complete by:  As directed      Diet - low sodium heart healthy    Complete by:  As directed      Discharge instructions    Complete by:  As directed   Oxygen via nasal cannula at 2 L/m.     Increase activity slowly    Complete by:  As directed             Medication List    TAKE these medications        baclofen 10 MG tablet  Commonly known as:  LIORESAL  Take 5 mg by mouth daily.     bisacodyl 5 MG EC tablet  Commonly known as:  DULCOLAX   Take 5 mg by mouth daily.     Cranberry 250 MG Caps  Take 1 capsule by mouth 2 (two) times daily.     diazepam 5 MG tablet  Commonly known as:  VALIUM  Take 5 mg by mouth 2 (two) times daily.     DULoxetine 30 MG capsule  Commonly known as:  CYMBALTA  Take 30 mg by mouth 3 (three) times daily.     guaiFENesin 600 MG 12 hr tablet  Commonly known as:  MUCINEX  Take 600 mg by mouth 2 (two) times daily as needed for cough.     levofloxacin 750 MG tablet  Commonly known as:  LEVAQUIN  Take 1 tablet (750 mg total) by mouth daily.  Start taking on:  04/04/2015     loperamide 2 MG tablet  Commonly known as:  IMODIUM A-D  Take 2 mg by mouth as needed for diarrhea or loose stools.     LYRICA 75 MG capsule  Generic drug:  pregabalin  Take 75 mg by mouth 2 (two) times daily.     mirabegron ER 50 MG Tb24 tablet  Commonly known as:  MYRBETRIQ  Take 50 mg by mouth daily.     polyethylene glycol  packet  Commonly known as:  MIRALAX / GLYCOLAX  Take 17 g by mouth daily.     potassium chloride SA 20 MEQ tablet  Commonly known as:  K-DUR,KLOR-CON  Take 1 tablet (20 mEq total) by mouth daily.     PRESCRIPTION MEDICATION  Take by mouth 2 (two) times daily. Swish and Swallow Viscous Lidocaine 2%/Nystatin Susp     QUEtiapine 50 MG tablet  Commonly known as:  SEROQUEL  Take 50-100 mg by mouth 2 (two) times daily. 50 mg in the am and 100 mg at bedtime.     ranitidine 150 MG tablet  Commonly known as:  ZANTAC  Take 150 mg by mouth 2 (two) times daily.     sertraline 100 MG tablet  Commonly known as:  ZOLOFT  Take 100 mg by mouth daily.     warfarin 2 MG tablet  Commonly known as:  COUMADIN  Take 2 tablets (4 mg total) by mouth daily at 6 PM.       Follow-up Information    Follow up with Arnette Felts. Schedule an appointment as soon as possible for a visit in 1 week.   Specialty:  General Practice   Contact information:   [address] [place]  [address] [phone]       Follow up with MD at Assisted Living Facility. Schedule an appointment as soon as possible for a visit in 2 days.   Why:  To be seen with repeat labs (PT, INR). Adjust Coumadin dose as needed.      Schedule an appointment as soon as possible for a visit with [facility].   Why:  To be seen this week to have suprapubic catheter changed.   Contact information:   [address] [place] [address] [place] [address] [phone]      Get Medicines reviewed and adjusted: Please take all your medications with you for your next visit with your Primary MD  Please request your Primary MD to go over all hospital tests and procedure/radiological results at the follow up. Please ask your Primary MD to get all Hospital records sent to his/her office.  If you experience worsening of your admission symptoms, develop shortness of breath, life threatening emergency, suicidal or homicidal thoughts you must seek medical attention immediately by calling 911 or calling your MD immediately if symptoms less severe.  You must read complete instructions/literature along with all the possible adverse reactions/side effects for all the Medicines you take and that have been prescribed to you. Take any new Medicines after you have completely understood and accept all the possible adverse reactions/side effects.   Do not drive when taking pain medications.   Do not take more than prescribed Pain, Sleep and Anxiety Medications  Special Instructions: If you have smoked or chewed Tobacco in the last 2 yrs please stop smoking, stop any regular Alcohol and or any Recreational drug use.  Wear Seat belts while driving.  Please note  You were cared for by a hospitalist during your hospital stay. Once you are discharged, your primary care physician will handle any further medical issues. Please note that NO REFILLS for any discharge medications will be authorized once you are  discharged, as it is imperative that you return to your primary care physician (or establish a relationship with a primary care physician if you do not have one) for your aftercare needs so that they can reassess your need for medications and monitor your lab  values.    The results of significant diagnostics from this hospitalization (including imaging, microbiology, ancillary and laboratory) are listed below for reference.    Significant Diagnostic Studies: Dg Chest 2 View  04/01/2015  CLINICAL DATA:  Acute onset of shortness of breath. Initial encounter. EXAM: CHEST  2 VIEW COMPARISON:  Chest radiograph from 02/26/2015 FINDINGS: Bibasilar airspace opacities may reflect pneumonia or possibly mild interstitial edema. Small bilateral pleural effusions are suspected. No pneumothorax is seen. The cardiomediastinal silhouette is borderline normal in size. No acute osseous abnormalities are identified. Postoperative change is noted about the left upper quadrant. IMPRESSION: Bibasilar airspace opacities may reflect pneumonia or possibly mild interstitial edema. Small bilateral pleural effusions suspected. Electronically Signed   By: Roanna Raider M.D.   On: 04/01/2015 01:26   Ct Chest Wo Contrast  04/01/2015  CLINICAL DATA:  65 year old male with pneumonia. EXAM: CT CHEST WITHOUT CONTRAST TECHNIQUE: Multidetector CT imaging of the chest was performed following the standard protocol without IV contrast. COMPARISON:  Radiograph dated 04/01/2015 FINDINGS: Evaluation of this exam is limited in the absence of intravenous contrast. There is an area of consolidative changes with air bronchogram at the right lung base concerning for pneumonia. Bibasilar airspace ground-glass densities may represent atelectasis/pneumonia or chronic changes. There is no pleural effusion or pneumothorax. The central airways are patent. The thoracic aorta and central pulmonary arteries appear unremarkable. There is no cardiomegaly or  pericardial effusion. No hilar or mediastinal adenopathy. The esophagus is grossly unremarkable. No thyroid nodules identified. No axillary adenopathy. Chest wall soft tissues appear unremarkable. Bilateral gynecomastia changes noted. Slightly prominent subcutaneous collaterals noted. There is degenerative changes of the spine. No acute fracture. Cholecystectomy. Splenectomy. Cirrhosis. The visualized upper abdomen is otherwise unremarkable. IMPRESSION: Bibasilar ground-glass densities with patchy area of consolidative changes at the right lung base concerning for pneumonia. Clinical correlation and follow-up recommended. Electronically Signed   By: Elgie Collard M.D.   On: 04/01/2015 03:33    Microbiology: Recent Results (from the past 240 hour(s))  Culture, blood (Routine X 2) w Reflex to ID Panel     Status: None (Preliminary result)   Collection Time: 04/01/15  2:10 AM  Result Value Ref Range Status   Specimen Description BLOOD LEFT HAND  Final   Special Requests IN PEDIATRIC BOTTLE  Final   Culture NO GROWTH 1 DAY  Final   Report Status PENDING  Incomplete  Culture, blood (Routine X 2) w Reflex to ID Panel     Status: None (Preliminary result)   Collection Time: 04/01/15  2:15 AM  Result Value Ref Range Status   Specimen Description BLOOD RIGHT HAND  Final   Special Requests IN PEDIATRIC BOTTLE  Final   Culture NO GROWTH 1 DAY  Final   Report Status PENDING  Incomplete  MRSA PCR Screening     Status: None   Collection Time: 04/01/15  4:48 AM  Result Value Ref Range Status   MRSA by PCR NEGATIVE NEGATIVE Final    Comment:        The GeneXpert MRSA Assay (FDA approved for NASAL specimens only), is one component of a comprehensive MRSA colonization surveillance program. It is not intended to diagnose MRSA infection nor to guide or monitor treatment for MRSA infections.      Labs: Basic Metabolic Panel:  Recent Labs Lab 04/01/15 0108 04/02/15 0533  NA 140 141   K 4.3 4.2  CL 103 110  CO2 26 23  GLUCOSE 99 83  BUN 6 6  CREATININE 1.09 0.83  CALCIUM 8.2* 8.3*   Liver Function Tests: No results for input(s): AST, ALT, ALKPHOS, BILITOT, PROT, ALBUMIN in the last 168 hours. No results for input(s): LIPASE, AMYLASE in the last 168 hours. No results for input(s): AMMONIA in the last 168 hours. CBC:  Recent Labs Lab 04/01/15 0108 04/02/15 0533  WBC 10.1 9.7  NEUTROABS 5.3  --   HGB 13.1 12.5*  HCT 41.0 40.7  MCV 96.0 95.8  PLT 452* 534*   Cardiac Enzymes: No results for input(s): CKTOTAL, CKMB, CKMBINDEX, TROPONINI in the last 168 hours. BNP: BNP (last 3 results)  Recent Labs  02/27/15 0326  BNP 95.4    ProBNP (last 3 results) No results for input(s): PROBNP in the last 8760 hours.  CBG: No results for input(s): GLUCAP in the last 168 hours.     Signed:  Marcellus Scott, MD, FACP, FHM. [facility] Pager [phone]  If 7PM-7AM, please contact night-coverage [email or web address] Password [facility] 04/03/2015, 12:37 PM

## 2015-04-03 NOTE — Progress Notes (Signed)
Pt discharged via EMS in stable condition

## 2015-04-06 LAB — CULTURE, BLOOD (ROUTINE X 2)
CULTURE: NO GROWTH
Culture: NO GROWTH

## 2017-01-20 IMAGING — CR DG CHEST 1V PORT
1 series · 1 of 1 positions shown · non-contrast
Comparison: 02/19/2015

CLINICAL DATA: Hypoxia

EXAM:
PORTABLE CHEST - 1 VIEW

[AP]
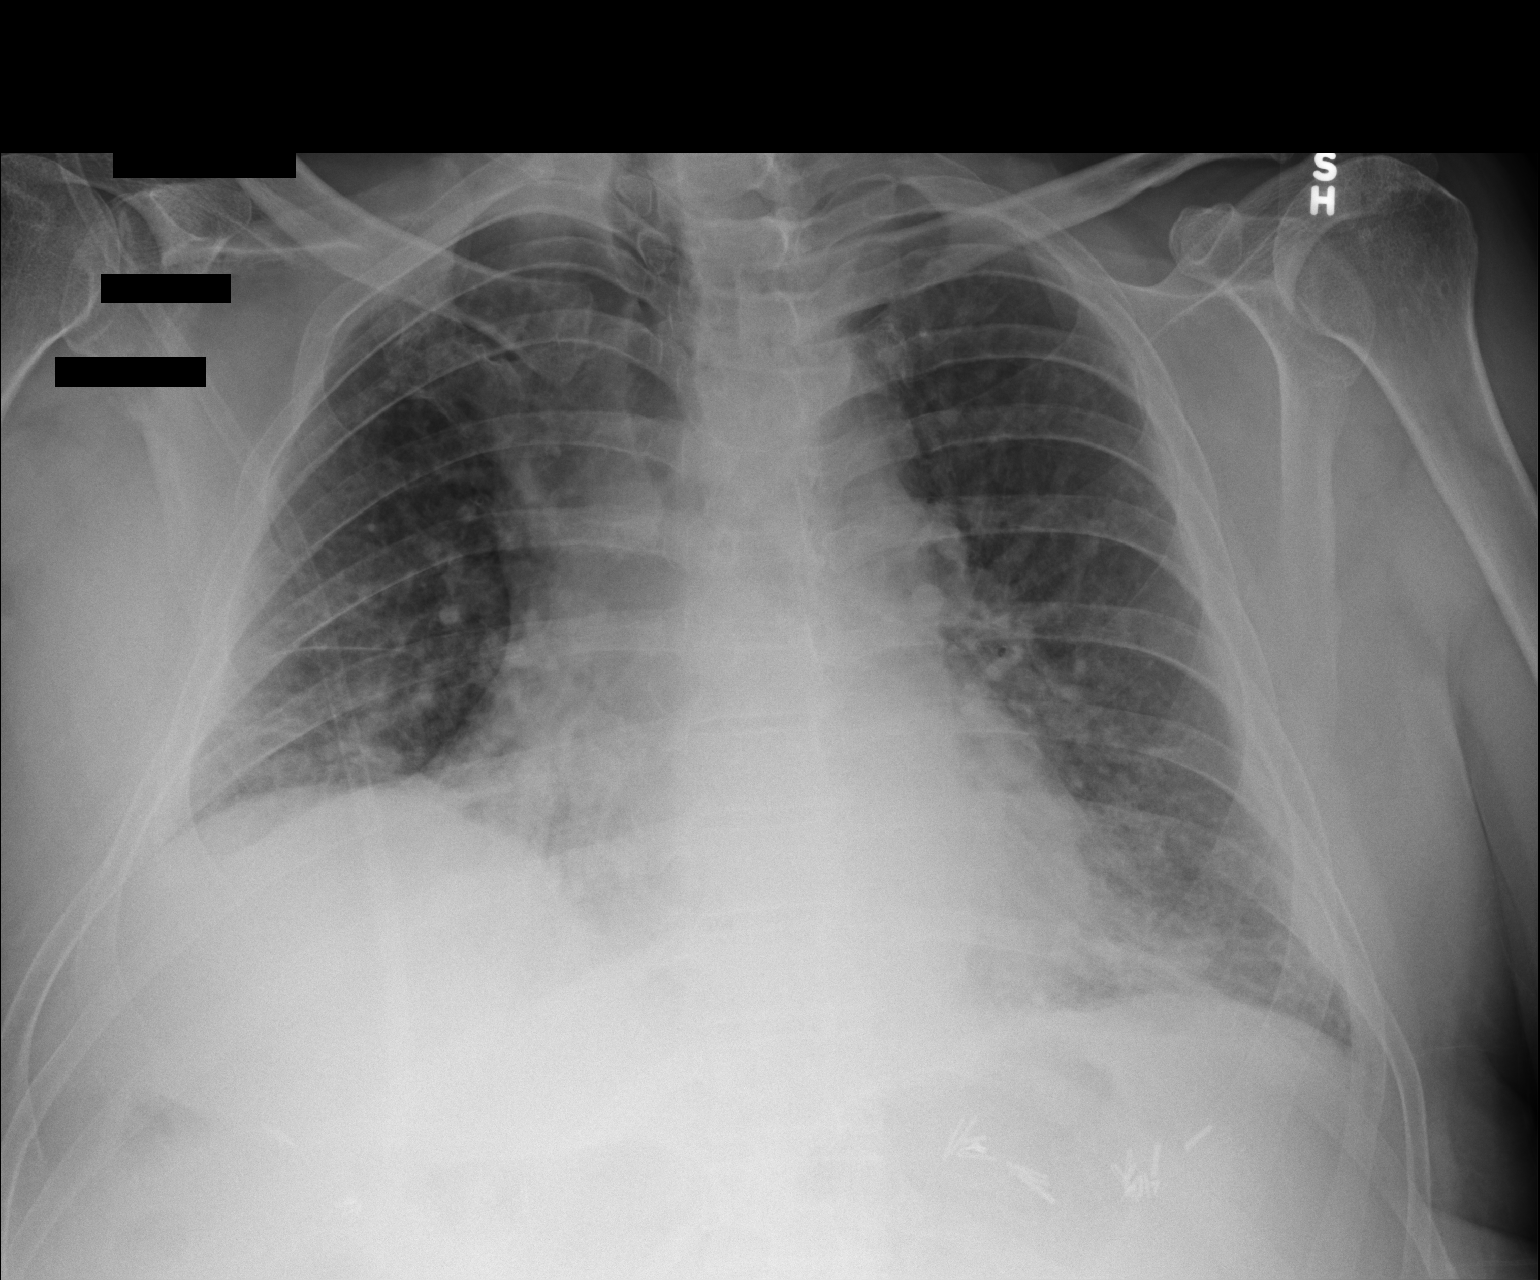

[1 of 1 positions shown; findings below may reference images not displayed]

FINDINGS: Cardiac shadow is mildly enlarged but stable. Elevation the right
hemidiaphragm is again seen. Bibasilar infiltrates are noted better
visualized due to the improved inspiratory effort. No bony
abnormality is noted.
IMPRESSION: Persistent bibasilar infiltrates.

## 2017-02-26 IMAGING — CT CT CHEST W/O CM
2 of 3 series · 11 of 36 positions shown, 13 images · non-contrast
Comparison: Radiograph dated 04/01/2015

CLINICAL DATA: 64-year-old male with pneumonia.

EXAM:
CT CHEST WITHOUT CONTRAST
TECHNIQUE: Multidetector CT imaging of the chest was performed following the
standard protocol without IV contrast.

[Series 201: chest without, idose (2) · axial · non-contrast · 0.96mm/px · z∈[+41,+321]mm · 8 of 66 slices shown, 10 images]
[im 5/66  mediastinal]
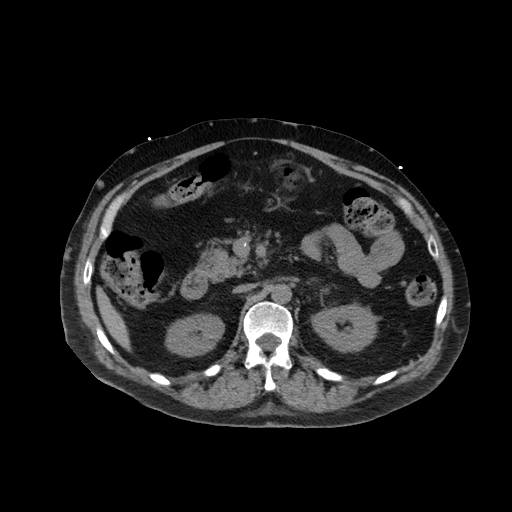
[im 5/66  lung]
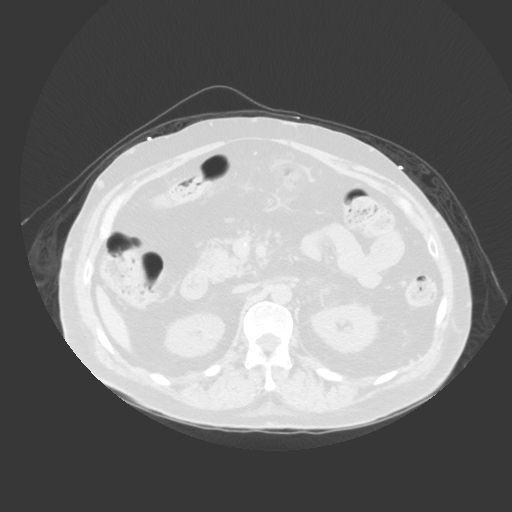
[im 13/66  lung]
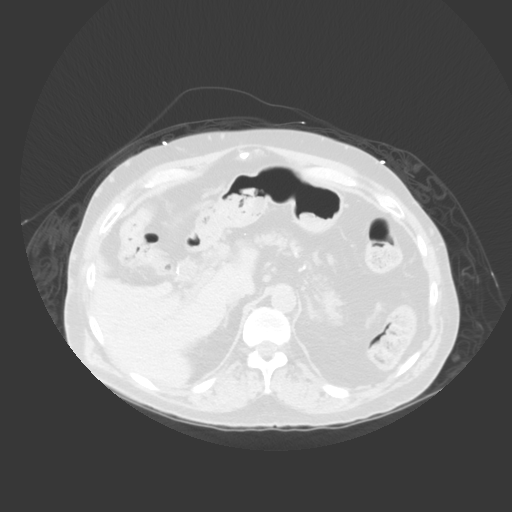
[im 22/66  lung]
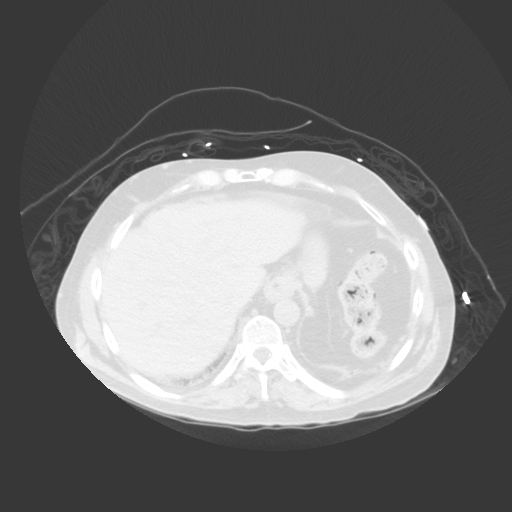
[im 29/66  lung]
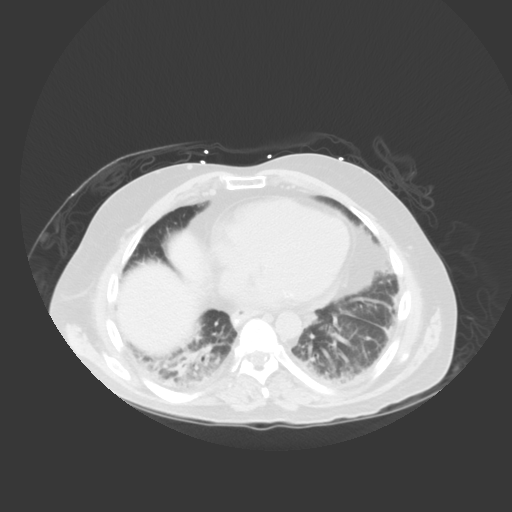
[im 37/66  mediastinal]
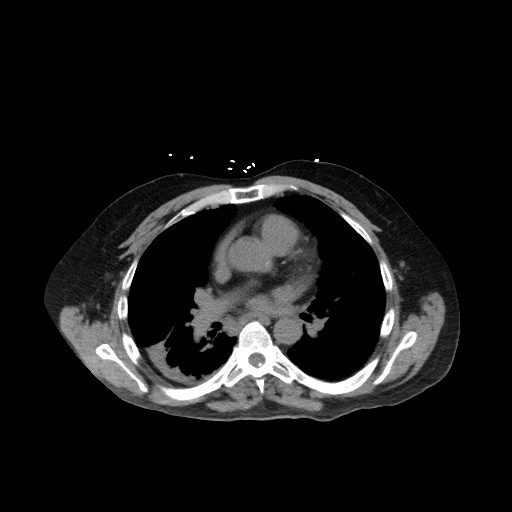
[im 37/66  lung]
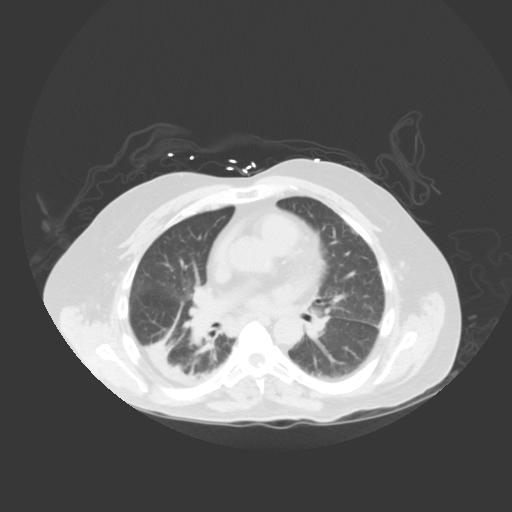
[im 44/66  lung]
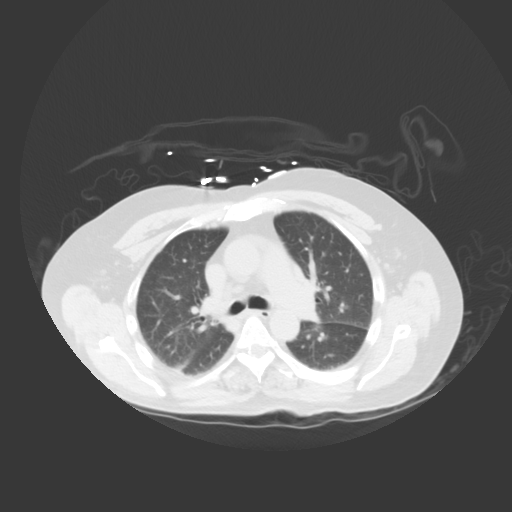
[im 53/66  lung]
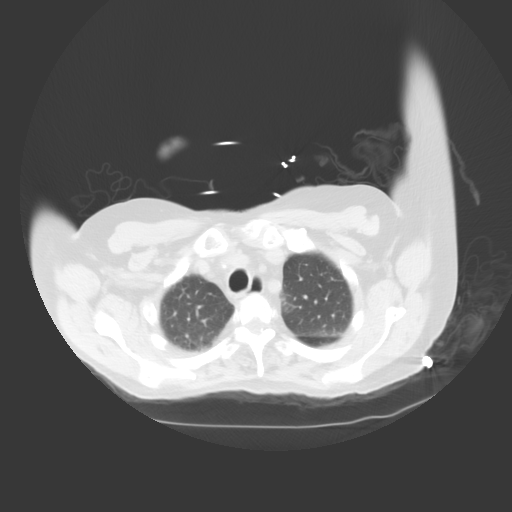
[im 61/66  lung]
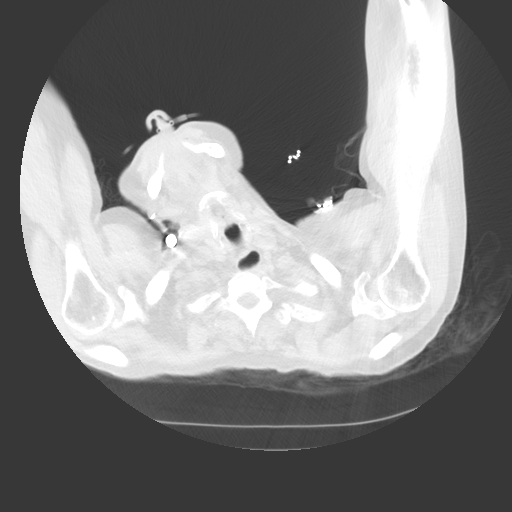

[Series 203: coronal, idose (2) · coronal · 0.45mm/px · 3 of 119 slices shown]
[im 24/119  lung]
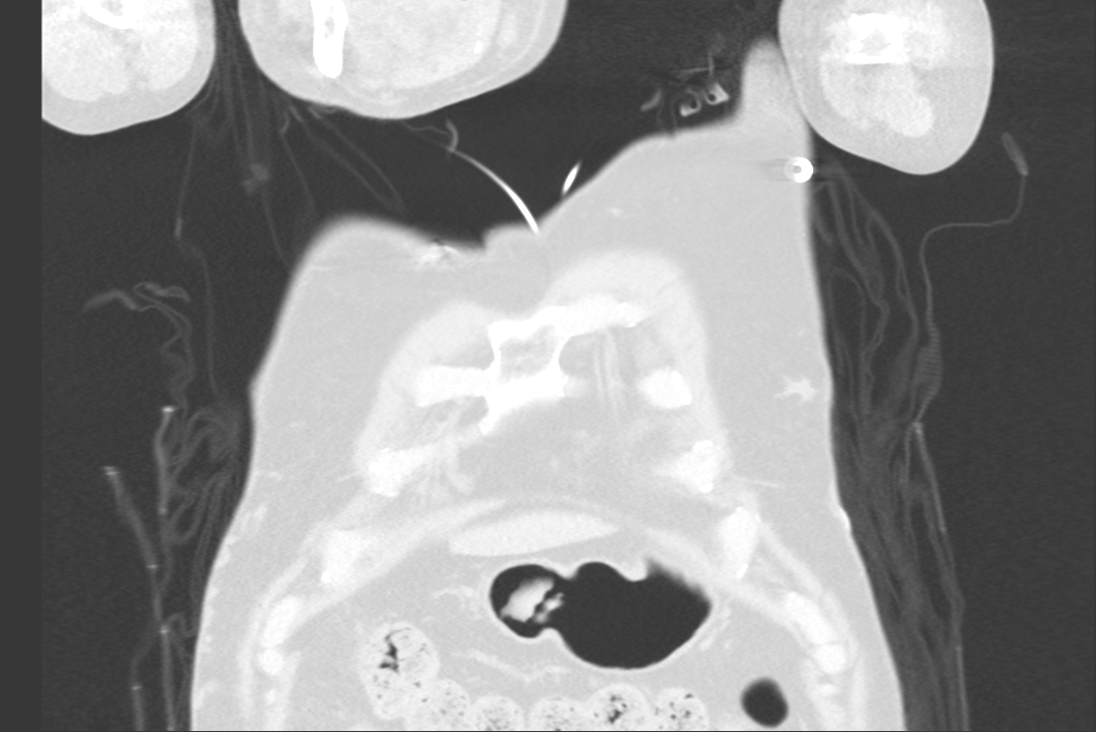
[im 48/119  lung]
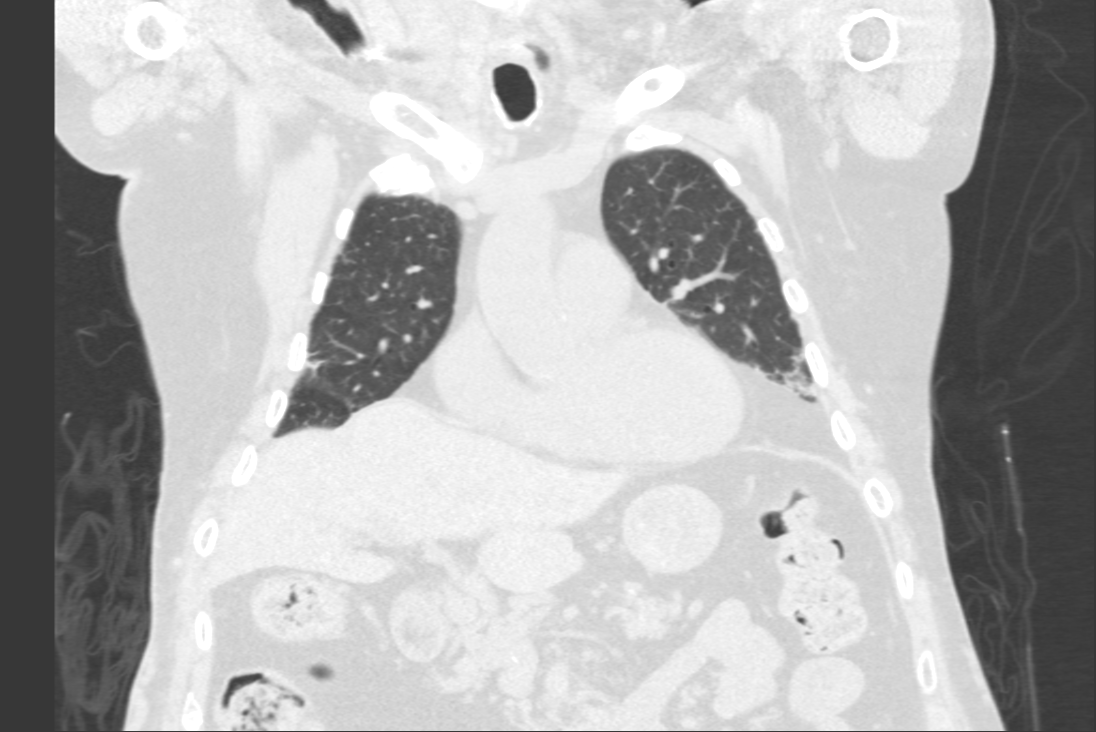
[im 71/119  lung]
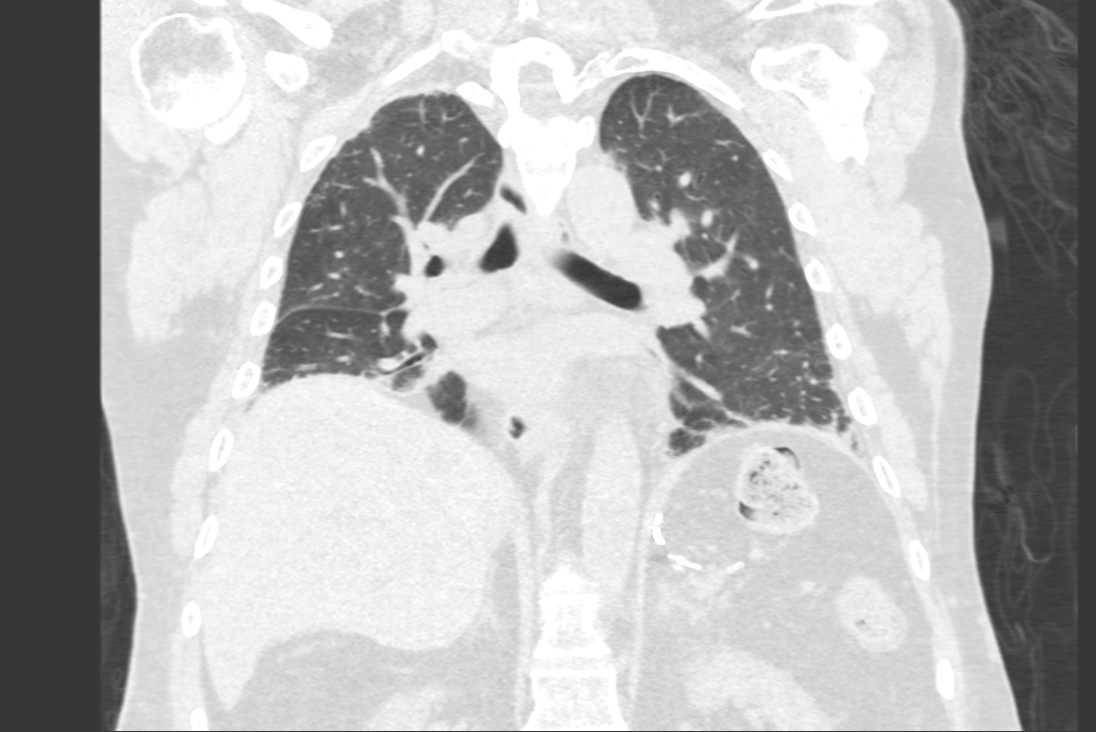

[11 of 36 positions shown; findings below may reference images not displayed]

FINDINGS: Evaluation of this exam is limited in the absence of intravenous
contrast.

There is an area of consolidative changes with air bronchogram at
the right lung base concerning for pneumonia. Bibasilar airspace
ground-glass densities may represent atelectasis/pneumonia or
chronic changes. There is no pleural effusion or pneumothorax. The
central airways are patent.

The thoracic aorta and central pulmonary arteries appear
unremarkable. There is no cardiomegaly or pericardial effusion. No
hilar or mediastinal adenopathy. The esophagus is grossly
unremarkable. No thyroid nodules identified. No axillary adenopathy.
Chest wall soft tissues appear unremarkable. Bilateral gynecomastia
changes noted. Slightly prominent subcutaneous collaterals noted.
There is degenerative changes of the spine. No acute fracture.

Cholecystectomy. Splenectomy. Cirrhosis. The visualized upper
abdomen is otherwise unremarkable.
IMPRESSION: Bibasilar ground-glass densities with patchy area of consolidative
changes at the right lung base concerning for pneumonia. Clinical
correlation and follow-up recommended.

## 2017-08-18 DEATH — deceased
